# Patient Record
Sex: Male | Born: 1985 | Race: White | Hispanic: No | Smoking: Current every day smoker
Health system: Southern US, Community
[De-identification: ages and names within clinical notes are randomized; demographics above are authoritative.]

## PROBLEM LIST (undated history)

## (undated) DIAGNOSIS — E039 Hypothyroidism, unspecified: Secondary | ICD-10-CM

## (undated) DIAGNOSIS — F32A Depression, unspecified: Secondary | ICD-10-CM

## (undated) DIAGNOSIS — F329 Major depressive disorder, single episode, unspecified: Secondary | ICD-10-CM

## (undated) DIAGNOSIS — F4310 Post-traumatic stress disorder, unspecified: Secondary | ICD-10-CM

## (undated) DIAGNOSIS — Z789 Other specified health status: Secondary | ICD-10-CM

## (undated) DIAGNOSIS — IMO0001 Reserved for inherently not codable concepts without codable children: Secondary | ICD-10-CM

## (undated) DIAGNOSIS — F419 Anxiety disorder, unspecified: Secondary | ICD-10-CM

## (undated) DIAGNOSIS — E349 Endocrine disorder, unspecified: Secondary | ICD-10-CM

## (undated) DIAGNOSIS — E079 Disorder of thyroid, unspecified: Secondary | ICD-10-CM

## (undated) HISTORY — DX: Reserved for inherently not codable concepts without codable children: IMO0001

## (undated) HISTORY — PX: TOTAL ABDOMINAL HYSTERECTOMY W/ BILATERAL SALPINGOOPHORECTOMY: SHX83

## (undated) HISTORY — PX: ABDOMINAL HYSTERECTOMY: SHX81

## (undated) HISTORY — DX: Depression, unspecified: F32.A

## (undated) HISTORY — DX: Post-traumatic stress disorder, unspecified: F43.10

## (undated) HISTORY — DX: Hypothyroidism, unspecified: E03.9

## (undated) HISTORY — PX: COSMETIC SURGERY: SHX468

## (undated) HISTORY — DX: Other specified health status: Z78.9

## (undated) HISTORY — DX: Endocrine disorder, unspecified: E34.9

## (undated) HISTORY — PX: FRACTURE SURGERY: SHX138

## (undated) HISTORY — DX: Anxiety disorder, unspecified: F41.9

## (undated) HISTORY — DX: Disorder of thyroid, unspecified: E07.9

---
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
## 2014-03-05 HISTORY — PX: MASTECTOMY: SHX3

## 2017-02-26 ENCOUNTER — Emergency Department
Admission: EM | Admit: 2017-02-26 | Discharge: 2017-02-26 | Disposition: A | Discharge status: Home / Self Care | Payer: BC Managed Care – PPO | Attending: Emergency Medicine | Admitting: Emergency Medicine

## 2017-02-26 DIAGNOSIS — F17200 Nicotine dependence, unspecified, uncomplicated: Secondary | ICD-10-CM | POA: Insufficient documentation

## 2017-02-26 DIAGNOSIS — E079 Disorder of thyroid, unspecified: Secondary | ICD-10-CM | POA: Insufficient documentation

## 2017-02-26 DIAGNOSIS — N907 Vulvar cyst: Secondary | ICD-10-CM

## 2017-02-26 DIAGNOSIS — N5089 Other specified disorders of the male genital organs: Secondary | ICD-10-CM | POA: Insufficient documentation

## 2017-02-26 DIAGNOSIS — Z79899 Other long term (current) drug therapy: Secondary | ICD-10-CM | POA: Insufficient documentation

## 2017-02-26 MED ORDER — HYDROCODONE-ACETAMINOPHEN 5-325 MG PO TABS
1.00 | ORAL_TABLET | Freq: Four times a day (QID) | ORAL | 0 refills | Status: AC | PRN
Start: 2017-02-26 — End: ?

## 2017-02-26 MED ORDER — SULFAMETHOXAZOLE-TRIMETHOPRIM 800-160 MG PO TABS
1.00 | ORAL_TABLET | Freq: Two times a day (BID) | ORAL | 0 refills | Status: AC
Start: 2017-02-26 — End: 2017-03-05

## 2017-02-26 MED ORDER — CEPHALEXIN 500 MG PO CAPS
500.00 mg | ORAL_CAPSULE | Freq: Three times a day (TID) | ORAL | 0 refills | Status: AC
Start: 2017-02-26 — End: 2017-03-05

## 2017-02-26 MED ORDER — ONDANSETRON HCL 4 MG/2ML IJ SOLN
4.00 mg | Freq: Once | INTRAMUSCULAR | Status: AC
Start: 2017-02-26 — End: 2017-02-26
  Administered 2017-02-26: 05:00:00 4 mg via INTRAVENOUS
  Filled 2017-02-26: qty 2

## 2017-02-26 MED ORDER — MORPHINE SULFATE 4 MG/ML IJ/IV SOLN (WRAP)
4.0000 mg | Freq: Once | Status: AC
Start: 2017-02-26 — End: 2017-02-26
  Administered 2017-02-26: 05:00:00 4 mg via INTRAVENOUS
  Filled 2017-02-26: qty 1

## 2017-02-26 NOTE — ED Triage Notes (Signed)
Pt reports cyst in genital left area that started 2 days ago. Pt denies cysts or abscess in the past. Pt denies drainage from the area.

## 2017-02-26 NOTE — ED Notes (Signed)
Bed: A10  Expected date:   Expected time:   Means of arrival:   Comments:

## 2017-02-26 NOTE — Discharge Instructions (Signed)
Abscess    You were diagnosed with an abscess of the:   Skin.    An abscess is a sore that is infected and filled with pus. It is caused by an infection with bacteria. Sometimes a splinter or other object stuck in the skin can cause an infection that can become an abscess. The body traps the infection in a tight pocket to try to stop the infection from spreading to other areas. The usual treatment is to make a cut in the abscess so the pus can drain out. Most abscesses heal quickly with no need for antibiotics.    Your doctor has determined that antibiotics ARE necessary to treat your abscess. Fill the prescription and take all medications as prescribed until they are all gone.    The doctor suspects that your infection is resistant to (not killed by) the usual antibiotics. This infection is caused by bacteria called Methicillin-Resistant Staphylococcus Aureus (MRSA).   Your doctor prescribed an antibiotic that will work against MRSA. IT IS IMPORTANT to fill and finish this prescription.    A drain and/or packing have been placed in your abscess to help the wound heal. The packing in your wound will need to be changed. This can be done by your family doctor, a referral doctor, this facility or the nearest Emergency Department. Your doctor will set up a plan for you to have the packing changed. Leave the drain and packing in place until you see a doctor. The drain might fall out on its own. If this happens, cover the abscess with a clean dressing (bandage) and follow up with a doctor as scheduled. Until the packing is removed, don't soak the wound in water, like in a bathtub or pool. Short showers or sponge baths are okay. Keep the wound as dry and clean as possible.    YOU SHOULD SEEK MEDICAL ATTENTION IMMEDIATELY, EITHER HERE OR AT THE NEAREST EMERGENCY DEPARTMENT, IF ANY OF THE FOLLOWING OCCURS:   You see unusual redness or swelling.   You see red streaks on the arm or leg.   The wound or drainage  smells bad.   You have fever (temperature higher than 100.4F / 38C), chills, worse pain or swelling.        Thank you for choosing [facility] for your emergency care needs.  We strive to provide EXCELLENT care to you and your family.      If you do not continue to improve or your condition worsens, please contact your doctor or return immediately to the Emergency Department.    ONSITE PHARMACY  Our full service onsite pharmacy is a 2 minute walk from the ER.  Open Mon to Fri from 8 am to 8 pm, Sat and Sun 9 am to 5 pm. Ask an ED staff member for directions.  We accept all major insurances and prices are competitive with major retailers.  Ask your provider to print your prescriptions down to the pharmacy to speed you on your way home.      DOCTOR REFERRALS  Call [phone] (available 24 hours a day, 7 days a week) if you need any further referrals and we can help you find a primary care doctor or specialist.  Also, available online at:  [email or web address]    YOUR CONTACT INFORMATION  Before leaving please check with registration to make sure we have an up-to-date contact number.  You can call registration at [phone] to update your information.  For questions about   your hospital bill, please call [phone].  For questions about your Emergency Dept Physician bill please call [phone].      FREE HEALTH SERVICES  If you need help with health or social services, please call 2-1-1 for a free referral to resources in your area.  2-1-1 is a free service connecting people with information on health insurance, free clinics, pregnancy, mental health, dental care, food assistance, housing, and substance abuse counseling.  Also, available online at:  http://www.[facility].org    MEDICAL RECORDS AND TESTS  Certain laboratory test results do not come back the same day, for example urine cultures.   We will contact you if other important findings are noted.  Radiology films  are often reviewed again to ensure accuracy.  If there is any discrepancy, we will notify you.      Please call [phone] to pick up a complimentary CD of any radiology studies performed.  If you or your doctor would like to request a copy of your medical records, please call [phone].          ORTHOPEDIC INJURY   Please know that significant injuries can exist even when an initial x-ray is read as normal or negative.  This can occur because some fractures (broken bones) are not initially visible on x-rays.  For this reason, close outpatient follow-up with your primary care doctor or bone specialist (orthopedist) is required.    MEDICATIONS AND FOLLOWUP  Please be aware that some prescription medications can cause drowsiness.  Use caution when driving or operating machinery.    The examination and treatment you have received in our Emergency Department is provided on an emergency basis, and is not intended to be a substitute for your primary care physician.  It is important that your doctor checks you again and that you report any new or remaining problems at that time.      24 HOUR PHARMACIES  [facility] - [address], [place], [place] [address] (1.4 miles, 7 minutes)  Handout with directions available on request      ASSISTANCE WITH INSURANCE    Affordable Care Act  (ACA)  Call to start or finish an application, compare plans, enroll or ask a question.  1-800-318-2596  TTY: 1-855-889-4325  Web:  Healthcare.gov    Help Enrolling in Medicaid  Cover [place]  (855) 242-8282 (TOLL-FREE)  (888) 221-1590 (TTY)  Web:  Http://www.coverva.org    Local Help Enrolling in the ACA  [facility]  ([phone] 748-2580 (MAIN)  Email:  health-help@nvfs.org  Web:  Http://www.nvfs.org  Address:  [address], [address] [place], [place] [address]

## 2017-02-26 NOTE — ED Provider Notes (Signed)
Physician/Midlevel provider first contact with patient: 02/26/17 0981         EMERGENCY DEPARTMENT HISTORY AND PHYSICAL EXAM    Patient Name: Justin Bolton, Justin Bolton  Patient DOB:  21-Aug-1985  MRN:  [id number]  Room:  10/A10  Rendering Provider: Tresea Mall, PA-C    History of Presenting Illness     Chief Complaint:   Chief Complaint   Patient presents with   . Cyst     Mechanism of Injury:       Historian:  Patient   Onset:   2 days ago   Severity:  Mild      31 y.o. 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 Pt states 2 days ago he had onset of left labial pain and swelling. Symptoms have been increasing since. No drainage. No previous similar episode. No vaginal discharge. No fevers. No body aches or chills. No urinary symptoms. No meds taken at home.     PMD:  [facility], Out Of Network    Past Medical History     Past Medical History:   Diagnosis Date   . Disorder of thyroid        Past Surgical History     History reviewed. No pertinent surgical history.    Family History     No family history on file.    Social History     Social History     Social History   . Marital status: Divorced     Spouse name: N/A   . Number of children: N/A   . Years of education: N/A     Social History Main Topics   . Smoking status: Current Every Day Smoker   . Smokeless tobacco: Never Used   . Alcohol use Yes   . Drug use: Unknown   . Sexual activity: Not on file     Other Topics Concern   . Not on file     Social History Narrative   . No narrative on file       Allergies     No Known Allergies    Home Medications     Home medications reviewed by PA at 5:45 AM    No current facility-administered medications for this encounter.     Current Outpatient Prescriptions:   .  levothyroxine (SYNTHROID, LEVOTHROID) 150 MCG tablet, Take 150 mcg by mouth Once a day at 6:00am., Disp: , Rfl:   .  cephalexin (KEFLEX) 500 MG capsule, Take 1 capsule (500 mg total) by mouth 3 (three) times daily.for 7 days, Disp: 21  capsule, Rfl: 0  .  HYDROcodone-acetaminophen (NORCO) 5-325 MG per tablet, Take 1 tablet by mouth every 6 (six) hours as needed for Pain., Disp: 6 tablet, Rfl: 0  .  sulfamethoxazole-trimethoprim (BACTRIM DS) 800-160 MG per tablet, Take 1 tablet by mouth 2 (two) times daily.for 7 days, Disp: 14 tablet, Rfl: 0    ED Medications Administered     ED Medication Orders     Start Ordered     Status Ordering Provider    02/26/17 0420 02/26/17 0419  morphine injection 4 mg  Once     Route: Intravenous  Ordered Dose: 4 mg     Last MAR action:  Given Tresea Mall SIMONS    02/26/17 0420 02/26/17 0419  ondansetron (ZOFRAN) injection 4 mg  Once     Route: Intravenous  Ordered Dose: 4 mg     Last MAR action:  Given Jaslin Novitski SIMONS  Review of Systems     Constitutional: Negative for fever and chills.   Respiratory: Negative for shortness of breath.   Cardiovascular: Negative for chest pain.   GU: Positive for vaginal abscess.   Skin: Negative for rash.   Neurological: Negative for headache or dizziness.   All other systems reviewed and negative     VS     Patient Vitals for the past 24 hrs:   BP Temp src Pulse Resp SpO2 Height Weight   02/26/17 0516 115/63 - 87 - 96 % - -   02/26/17 0440 128/74 - (!) 101 - 98 % - -   02/26/17 0330 147/79 Oral 79 16 98 % 5\' 7"  (1.702 m) 84.8 kg       Physical Exam     Constitutional: Vital signs reviewed. Well appearing.  Head: Normocephalic, atraumatic  Eyes: Conjunctiva and sclera are normal.  No injection or discharge.  Neck: Normal range of motion. Trachea midline.  Respiratory/Chest: No respiratory distress.   GU: Dime sized area of fluctuation to left labia majora with edema extending towards clitoris. No drainage. No bleeding. Mild erythema.   Neurological: No focal motor deficits by observation. Speech normal.  Skin: Warm and dry. No rash.  Psychiatric: Alert and conversant.  Normal affect.  Normal insight.     Data Review     Nursing notes reviewed and agree:  Yes    Diagnostic Study Results     Labs:     Results     ** No results found for the last 24 hours. **          Radiologic Studies:  Radiology Results (24 Hour)     ** No results found for the last 24 hours. **      .    EKG:      Monitor:      Procedures     Incision and Drainage:  Verbal consent obtained  Performed by Tresea Mall, PA-C  Indication: Cutaneous Abscess  No contraindications  Anesthesia: Lido 1%without epi via local injection  Location: Left labia majora.   Incision made over area of fluctuance with #11 blade scalpel   Area explored for loculations  Drained:small amount of pus.   Dressing placed  Minimal blood loss, bleeding controlled after procedure.   Neurovascular status normal after procedure  No complications, patient tolerated procedure well.      MDM and Clinical Notes     Small amount of pus drained, pt reports feeling significantly better after drainage. Will cover with antibiotics for amount of edema to cover for cellulitic component. Advised f/u with his pmd or with obgyn for further management. Return instructions discussed. Pt advised to return to ER for worsening symptoms or other concerns. Pt ambulated from dept without difficulty upon discharge in no distress.          Diagnosis and Disposition     Clinical Impression  1. Vulvar cyst        Disposition  ED Disposition     ED Disposition Condition Date/Time Comment    Discharge  Tue Feb 26, 2017  5:11 AM Justin Bolton discharge to home/self care.    Condition at disposition: Stable          Prescriptions  Discharge Medication List as of 02/26/2017  5:11 AM      START taking these medications    Details   cephalexin (KEFLEX) 500 MG capsule Take 1 capsule (500 mg total) by mouth 3 (  three) times daily.for 7 days, Starting Tue 02/26/2017, Until Tue 03/05/2017, Print      HYDROcodone-acetaminophen (NORCO) 5-325 MG per tablet Take 1 tablet by mouth every 6 (six) hours as needed for Pain., Starting Tue 02/26/2017, Print       sulfamethoxazole-trimethoprim (BACTRIM DS) 800-160 MG per tablet Take 1 tablet by mouth 2 (two) times daily.for 7 days, Starting Tue 02/26/2017, Until Tue 03/05/2017, Print                Berna Bue Front[place], [place]  02/26/17 6045       Venancio Poisson, MD  02/26/17 [phone]

## 2018-06-12 DIAGNOSIS — Z789 Other specified health status: Secondary | ICD-10-CM | POA: Insufficient documentation

## 2018-06-12 DIAGNOSIS — F17200 Nicotine dependence, unspecified, uncomplicated: Secondary | ICD-10-CM | POA: Insufficient documentation

## 2018-06-12 DIAGNOSIS — F4310 Post-traumatic stress disorder, unspecified: Secondary | ICD-10-CM | POA: Insufficient documentation

## 2018-06-12 DIAGNOSIS — E039 Hypothyroidism, unspecified: Secondary | ICD-10-CM | POA: Insufficient documentation

## 2019-01-01 ENCOUNTER — Ambulatory Visit (INDEPENDENT_AMBULATORY_CARE_PROVIDER_SITE_OTHER): Payer: 59 | Admitting: Osteopathic Medicine

## 2019-01-01 ENCOUNTER — Other Ambulatory Visit: Payer: Self-pay

## 2019-01-01 VITALS — BP 129/84 | HR 93 | Temp 97.9°F | Ht 68.0 in | Wt 239.0 lb

## 2019-01-01 DIAGNOSIS — F4310 Post-traumatic stress disorder, unspecified: Secondary | ICD-10-CM | POA: Diagnosis not present

## 2019-01-01 DIAGNOSIS — E039 Hypothyroidism, unspecified: Secondary | ICD-10-CM

## 2019-01-01 DIAGNOSIS — Z0000 Encounter for general adult medical examination without abnormal findings: Secondary | ICD-10-CM

## 2019-01-01 DIAGNOSIS — F640 Transsexualism: Secondary | ICD-10-CM | POA: Diagnosis not present

## 2019-01-01 DIAGNOSIS — Z789 Other specified health status: Secondary | ICD-10-CM

## 2019-01-01 DIAGNOSIS — F17200 Nicotine dependence, unspecified, uncomplicated: Secondary | ICD-10-CM

## 2019-01-02 ENCOUNTER — Encounter: Payer: Self-pay | Admitting: Osteopathic Medicine

## 2019-01-02 MED ORDER — TESTOSTERONE CYPIONATE 200 MG/ML IM SOLN: 100.0000 mg | INTRAMUSCULAR | 1 refills | Status: DC

## 2019-01-02 MED ORDER — LEVOTHYROXINE SODIUM 175 MCG PO TABS: 175.0000 ug | ORAL_TABLET | Freq: Every day | ORAL | 0 refills | Status: DC

## 2019-01-02 NOTE — Progress Notes (Signed)
HPI: Daniel Nielsen is a 33 y.o. 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  he [facility] to [facility] today, 01/01/19,  for chief complaint of: New to establish care   Very pleasant patient here to establish care. He is AFAB, FTM transgender in need of refills on medications. No other complaints today. He and his fiancee recently moved to the area, she is currently pregnant and doing well. Only other medical history is hypothyroidism, and depression/PTSD and he is well controlled on medications as below. He sees a psychiatrist for Lexapro and Abilify Rx's         Past medical, surgical, social and family history reviewed:  There are no active problems to display for this patient.  Past Surgical History:  Procedure Laterality Date  . MASTECTOMY  2016   Social History   Tobacco Use  . Smoking status: Current Every Day Smoker    Packs/day: 0.50    Types: Cigarettes  . Smokeless tobacco: Never Used  Substance Use Topics  . Alcohol use: Not Currently    History reviewed. No pertinent family history.      Current medication list and allergy/intolerance information reviewed:    Current Outpatient Medications  Medication Sig Dispense Refill  . ARIPiprazole (ABILIFY) 5 MG tablet Take 5 mg by mouth daily.    Marland Kitchen. escitalopram (LEXAPRO) 20 MG tablet Take by mouth.    . levothyroxine (SYNTHROID) 175 MCG tablet Take 1 tablet (175 mcg total) by mouth daily before breakfast. 90 tablet 0  . testosterone cypionate (DEPOTESTOSTERONE CYPIONATE) 200 MG/ML injection Inject 0.5 mLs (100 mg total) into the muscle once a week. 10 mL 1   No current facility-administered medications for this visit.     No Known Allergies    Review of Systems:  Constitutional:  No  fever, no chills, No recent illness, No unintentional weight  changes. No significant fatigue.   HEENT: No  headache, no vision change, no hearing change, No sore throat, No  sinus pressure  Cardiac: No  chest pain, No  pressure, No palpitations, No  Orthopnea  Respiratory:  No  shortness of breath. No  Cough  Gastrointestinal: No  abdominal pain, No  nausea, No  vomiting,  No  blood in stool, No  diarrhea, No  constipation   Musculoskeletal: No new myalgia/arthralgia  Skin: No  Rash, No other wounds/concerning lesions  Genitourinary: No  incontinence, No  abnormal genital bleeding, No abnormal genital discharge  Hem/Onc: No  easy bruising/bleeding, No  abnormal lymph node  Endocrine: No cold intolerance,  No heat intolerance. No polyuria/polydipsia/polyphagia   Neurologic: No  weakness, No  dizziness, No  slurred speech/focal weakness/facial droop  Psychiatric: No  concerns with depression, No  concerns with anxiety, No sleep problems, No mood problems  Exam:  BP 129/84 (BP Location: Left Arm, Patient Position: Sitting, Cuff Size: Normal)   Pulse 93   Temp 97.9 F (36.6 C) (Oral)   Ht 5\' 8"  (1.727 m)   Wt 239 lb (108.4 kg)   BMI 36.34 kg/m   Constitutional: VS see above. General Appearance: alert, well-developed, well-nourished, NAD  Eyes: Normal lids and conjunctive, non-icteric sclera  Ears, Nose, Mouth, Throat:TM normal bilaterally.  Neck: No masses, trachea midline. No thyroid enlargement. No tenderness/mass appreciated. No lymphadenopathy  Respiratory: Normal respiratory effort. no wheeze, no rhonchi, no rales  Cardiovascular: S1/S2 normal, no murmur,  no rub/gallop auscultated. RRR. No lower extremity edema.  Gastrointestinal: Nontender, no masses. No hepatomegaly, no splenomegaly. No hernia appreciated. Bowel sounds normal. Rectal exam deferred.   Musculoskeletal: Gait normal. No clubbing/cyanosis of digits.   Neurological: Normal balance/coordination. No tremor.   Skin: warm, dry, intact. No  rash/ulcer.  Psychiatric: Normal judgment/insight. Normal mood and affect. Oriented x3.    No results found for this or any previous visit (from the past 72 hour(s)).  No results found.      ASSESSMENT/PLAN: The primary encounter diagnosis was Annual physical exam. Diagnoses of Male-to-male transgender person, Hypothyroidism, unspecified type, PTSD (post-traumatic stress disorder), and Smoker were also pertinent to this visit.     Meds ordered this encounter  Medications  . levothyroxine (SYNTHROID) 175 MCG tablet    Sig: Take 1 tablet (175 mcg total) by mouth daily before breakfast.    Dispense:  90 tablet    Refill:  0    Needs labs done prior to further refills - orders are in as of 01/02/19  . testosterone cypionate (DEPOTESTOSTERONE CYPIONATE) 200 MG/ML injection    Sig: Inject 0.5 mLs (100 mg total) into the muscle once a week.    Dispense:  10 mL    Refill:  1    Patient Instructions  General Preventive Care  Most recent routine screening lipids/other labs: ordered today, will plan to get these done next couple weeks.   Everyone should have blood pressure checked once per year. Goal 130/80 or less!   Tobacco: don't! Please let me know if you need help quitting!  Alcohol: responsible moderation is ok for most adults - if you have concerns about your alcohol intake, please talk to me!   Exercise: as tolerated to reduce risk of cardiovascular disease and diabetes. Strength training will also prevent osteoporosis.   Mental health: if need for mental health care (medicines, counseling, other), or concerns about moods, please let me or your psychiatrist know!   Sexual health: if need for STD testing, or if concerns with libido/pain problems, please let me know!   Advanced Directive: Living Will and/or Healthcare Power of Attorney recommended for all adults, regardless of age or health.  Vaccines  Flu vaccine: recommended for almost everyone, every fall.    Tetanus booster: Tdap recommended every 10 years.  Cancer screenings   Colon cancer screening: recommended for everyone starting at age 76 or 41  Prostate cancer screening: not needed for people without prostates!   Breast cancer screening: not needed for people without breast tissue!   Cervical cancer screening: Pap for people with a cervix, every 1 to 5 years depending on age and other risk factors.Smokers should get Pap testing more frequently! I can't think of a better reason to quit!   Lung cancer screening: CT chest every year for those age 29 to 31 years old with ?30 pack year smoking history, who either currently smoke or have quit within the past 15 years. Infection screenings . HIV: recommended screening at least once age 24-65, more often as needed. . Gonorrhea/Chlamydia: screening as needed. . Hepatitis C: recommended for anyone born 20-1963 . TB: certain at-risk populations, or depending on work requirements and/or travel history Other . Bone Density Test: recommended around age 23-70 . Abdominal Aortic Aneurysm: screening with ultrasound recommended once for cisgender men age 12-75 who have ever smoked, no good data on transgender men but I would bet the testosterone + smoking gives same risk to both groups so we should plan  on this test in the future, unless guidelines change by then!        Visit summary with medication list and pertinent instructions was printed for patient to review. All questions at time of visit were answered - patient instructed to contact office with any additional concerns or updates. ER/RTC precautions were reviewed with the patient.      Please note: voice recognition software was used to produce this document, and typos may escape review. Please contact Dr. Lyn Hollingshead for any needed clarifications.     Follow-up plan: Return in about 6 months (around 07/02/2019) for monitor testosterone therapy .

## 2019-01-02 NOTE — Patient Instructions (Addendum)
General Preventive Care  Most recent routine screening lipids/other labs: ordered today, will plan to get these done next couple weeks.   Everyone should have blood pressure checked once per year. Goal 130/80 or less!   Tobacco: don't! Please let me know if you need help quitting!  Alcohol: responsible moderation is ok for most adults - if you have concerns about your alcohol intake, please talk to me!   Exercise: as tolerated to reduce risk of cardiovascular disease and diabetes. Strength training will also prevent osteoporosis.   Mental health: if need for mental health care (medicines, counseling, other), or concerns about moods, please let me or your psychiatrist know!   Sexual health: if need for STD testing, or if concerns with libido/pain problems, please let me know!   Advanced Directive: Living Will and/or Healthcare Power of Attorney recommended for all adults, regardless of age or health.  Vaccines  Flu vaccine: recommended for almost everyone, every fall.   Tetanus booster: Tdap recommended every 10 years.  Cancer screenings   Colon cancer screening: recommended for everyone starting at age 91 or 88  Prostate cancer screening: not needed for people without prostates!   Breast cancer screening: not needed for people without breast tissue!   Cervical cancer screening: Pap for people with a cervix, every 1 to 5 years depending on age and other risk factors.Smokers should get Pap testing more frequently! I can't think of a better reason to quit!   Lung cancer screening: CT chest every year for those age 20 to 31 years old with ?30 pack year smoking history, who either currently smoke or have quit within the past 15 years. Infection screenings . HIV: recommended screening at least once age 42-65, more often as needed. . Gonorrhea/Chlamydia: screening as needed. . Hepatitis C: recommended for anyone born 13-1963 . TB: certain at-risk populations, or depending on work  requirements and/or travel history Other . Bone Density Test: recommended around age 27-70 . Abdominal Aortic Aneurysm: screening with ultrasound recommended once for cisgender men age 92-75 who have ever smoked, no good data on transgender men but I would bet the testosterone + smoking gives same risk to both groups so we should plan on this test in the future, unless guidelines change by then!

## 2019-01-05 ENCOUNTER — Telehealth: Payer: Self-pay | Admitting: Osteopathic Medicine

## 2019-01-05 NOTE — Telephone Encounter (Signed)
Received fax for prior authorization on Testosterone sent through cover my meds and it was approved waiting on fax approval to get auth number and information. - CF

## 2019-01-05 NOTE — Telephone Encounter (Signed)
Received Faxed authorization   Valid: 01/05/19 - 01/05/20 PA# [employer] - [place]- [place]- [phone] SS

## 2019-01-21 ENCOUNTER — Emergency Department (HOSPITAL_COMMUNITY): Payer: Worker's Compensation

## 2019-01-21 ENCOUNTER — Other Ambulatory Visit: Payer: Self-pay

## 2019-01-21 ENCOUNTER — Encounter (HOSPITAL_COMMUNITY): Payer: Self-pay | Admitting: *Deleted

## 2019-01-21 ENCOUNTER — Emergency Department (HOSPITAL_COMMUNITY)
Admission: EM | Admit: 2019-01-21 | Discharge: 2019-01-21 | Disposition: A | Discharge status: Home / Self Care | Payer: Worker's Compensation | Attending: Emergency Medicine | Admitting: Emergency Medicine

## 2019-01-21 DIAGNOSIS — Z79899 Other long term (current) drug therapy: Secondary | ICD-10-CM | POA: Diagnosis not present

## 2019-01-21 DIAGNOSIS — R52 Pain, unspecified: Secondary | ICD-10-CM

## 2019-01-21 DIAGNOSIS — W010XXA Fall on same level from slipping, tripping and stumbling without subsequent striking against object, initial encounter: Secondary | ICD-10-CM | POA: Diagnosis not present

## 2019-01-21 DIAGNOSIS — S83004A Unspecified dislocation of right patella, initial encounter: Secondary | ICD-10-CM | POA: Insufficient documentation

## 2019-01-21 DIAGNOSIS — F17210 Nicotine dependence, cigarettes, uncomplicated: Secondary | ICD-10-CM | POA: Insufficient documentation

## 2019-01-21 DIAGNOSIS — F1210 Cannabis abuse, uncomplicated: Secondary | ICD-10-CM | POA: Insufficient documentation

## 2019-01-21 DIAGNOSIS — S82891A Other fracture of right lower leg, initial encounter for closed fracture: Secondary | ICD-10-CM | POA: Insufficient documentation

## 2019-01-21 DIAGNOSIS — Z20828 Contact with and (suspected) exposure to other viral communicable diseases: Secondary | ICD-10-CM | POA: Insufficient documentation

## 2019-01-21 DIAGNOSIS — Y990 Civilian activity done for income or pay: Secondary | ICD-10-CM | POA: Insufficient documentation

## 2019-01-21 DIAGNOSIS — Y929 Unspecified place or not applicable: Secondary | ICD-10-CM | POA: Diagnosis not present

## 2019-01-21 DIAGNOSIS — S8991XA Unspecified injury of right lower leg, initial encounter: Secondary | ICD-10-CM | POA: Diagnosis present

## 2019-01-21 DIAGNOSIS — F640 Transsexualism: Secondary | ICD-10-CM | POA: Insufficient documentation

## 2019-01-21 DIAGNOSIS — Y939 Activity, unspecified: Secondary | ICD-10-CM | POA: Insufficient documentation

## 2019-01-21 DIAGNOSIS — W19XXXA Unspecified fall, initial encounter: Secondary | ICD-10-CM

## 2019-01-21 LAB — SARS CORONAVIRUS 2 (TAT 6-24 HRS): SARS Coronavirus 2: NEGATIVE

## 2019-01-21 MED ORDER — FENTANYL CITRATE (PF) 100 MCG/2ML IJ SOLN
50.0000 ug | Freq: Once | INTRAMUSCULAR | Status: DC
  Filled 2019-01-21: qty 2

## 2019-01-21 MED ORDER — OXYCODONE-ACETAMINOPHEN 5-325 MG PO TABS: 1.0000 | ORAL_TABLET | Freq: Four times a day (QID) | ORAL | 0 refills | Status: DC | PRN

## 2019-01-21 MED ORDER — OXYCODONE-ACETAMINOPHEN 5-325 MG PO TABS
1.0000 | ORAL_TABLET | Freq: Once | ORAL | Status: AC
  Administered 2019-01-21: 1 via ORAL
  Filled 2019-01-21: qty 1

## 2019-01-21 MED ORDER — FENTANYL CITRATE (PF) 100 MCG/2ML IJ SOLN
75.0000 ug | Freq: Once | INTRAMUSCULAR | Status: AC
  Administered 2019-01-21: 75 ug via INTRAVENOUS

## 2019-01-21 NOTE — Progress Notes (Signed)
Orthopedic Tech Progress Note Patient Details:  Demarious Kapur [birth year] [id number]  Ortho Devices Type of Ortho Device: Crutches, Stirrup splint, Post (short leg) splint, Ace wrap, Knee Immobilizer Ortho Device/Splint Location: right leg Ortho Device/Splint Interventions: Ordered, Application, Adjustment   Post Interventions Patient Tolerated: Well Instructions Provided: Adjustment of device, Care of device, Poper ambulation with device   Staci Righter 01/21/2019, 2:46 PM

## 2019-01-21 NOTE — H&P (View-Only) (Signed)
Reason for Consult:Right patella dislocation, right ankle fx Referring Physician: A Curatolo  Daniel Nielsen is an 33 y.o. male.  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 He had immediate knee and ankle pain and could not bear weight. He came to the ED where x-rays showed a right patella dislocation and a right ankle fx and orthopedic surgery was consulted. The patella was relocated by the EDPA.  Past Medical History:  Diagnosis Date  . Anxiety   . Depression   . 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 Hypothyroid   . PTSD (post-traumatic stress disorder)     Past Surgical History:  Procedure Laterality Date  . MASTECTOMY  2016    History reviewed. No pertinent family history.  Social History:  reports that he has been smoking cigarettes. He has been smoking about 0.50 packs per day. He has never used smokeless tobacco. He reports previous alcohol use. He reports current drug use. Drug: Marijuana.  Allergies: No Known Allergies  Medications: I have reviewed the patient's current medications.  No results found for this or any previous visit (from the past 48 hour(s)).  Dg Knee 1-2 Views Right  Result Date: 01/21/2019 CLINICAL DATA:  Recent fall from standing, initial encounter EXAM: RIGHT KNEE - 1-2 VIEW COMPARISON:  None. FINDINGS: The patella is laterally dislocated. No other fracture is seen. No gross soft tissue abnormality is noted. IMPRESSION: Patellar dislocation laterally. Electronically Signed   By: Alcide Clever M.D.   On: 01/21/2019 12:44   Dg Ankle Complete Right  Result Date: 01/21/2019 CLINICAL DATA:  Recent fall with ankle pain, initial encounter EXAM: RIGHT ANKLE - COMPLETE 3+ VIEW COMPARISON:  None. FINDINGS: Soft tissue swelling is noted particularly medially. There is a transverse fracture through the medial malleolus which extends posteriorly. A posterior malleolar fracture is noted as well. No  definitive lateral malleolar fracture is noted. IMPRESSION: Medial and posterior malleolar fractures with associated soft tissue swelling Electronically Signed   By: Alcide Clever M.D.   On: 01/21/2019 12:45   Dg Knee Right Port  Result Date: 01/21/2019 CLINICAL DATA:  Status post reduction of patellar dislocation. EXAM: PORTABLE RIGHT KNEE - 1-2 VIEW COMPARISON:  Same day. FINDINGS: Lateral patellar dislocation noted on prior exam has been successfully reduced. No fracture is noted. Joint spaces are intact. IMPRESSION: Successful reduction of lateral patellar dislocation. Electronically Signed   By: Lupita Raider M.D.   On: 01/21/2019 13:18    Review of Systems  Constitutional: Negative for weight loss.  HENT: Negative for ear discharge, ear pain, hearing loss and tinnitus.   Eyes: Negative for blurred vision, double vision, photophobia and pain.  Respiratory: Negative for cough, sputum production and shortness of breath.   Cardiovascular: Negative for chest pain.  Gastrointestinal: Negative for abdominal pain, nausea and vomiting.  Genitourinary: Negative for dysuria, flank pain, frequency and urgency.  Musculoskeletal: Positive for joint pain (Right knee, ankle). Negative for back pain, falls, myalgias and neck pain.  Neurological: Negative for dizziness, tingling, sensory change, focal weakness, loss of consciousness and headaches.  Endo/Heme/Allergies: Does not bruise/bleed easily.  Psychiatric/Behavioral: Negative for depression, memory loss and substance abuse. The patient is not nervous/anxious.    Blood pressure 114/81, pulse 65, temperature 98.3 F (36.8 C), temperature source Oral, resp. rate 17, height 5\' 8"  (1.727 m), weight 108.4 kg, SpO2 100 %. Physical Exam  Constitutional: He appears well-developed and well-nourished. No distress.  HENT:  Head: Normocephalic and atraumatic.  Eyes: Conjunctivae are normal. Right eye exhibits no discharge. Left eye exhibits no discharge. No  scleral icterus.  Neck: Normal range of motion.  Cardiovascular: Normal rate and regular rhythm.  Respiratory: Effort normal. No respiratory distress.  Musculoskeletal:     Comments: RLE No traumatic wounds, ecchymosis, or rash  TTP knee, ankle, mod ankle edema  No knee effusion  Knee stable to varus/ valgus and anterior/posterior stress  Sens DPN, SPN, TN intact  Motor EHL, ext, flex, evers 5/5  DP 2+, PT 0, No significant edema  Neurological: He is alert.  Skin: Skin is warm and dry. He is not diaphoretic.  Psychiatric: He has a normal mood and affect. His behavior is normal.    Assessment/Plan: Right patella dislocation -- KI Right ankle fx -- Will need ORIF, plan Friday by Dr. Doreatha Martin. Elevation and ice in the meantime. Tobacco use -- Encouraged cessation    Lisette Abu, PA-C Orthopedic Surgery [phone] 01/21/2019, 1:44 PM

## 2019-01-21 NOTE — ED Triage Notes (Addendum)
Pt reports he fell down a hill when he was getting back into [employer]. Pt has injury to RT knee.

## 2019-01-21 NOTE — Consult Note (Signed)
Reason for Consult:Right patella dislocation, right ankle fx Referring Physician: A Curatolo  Daniel Nielsen is an 33 y.o. male.  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 He had immediate knee and ankle pain and could not bear weight. He came to the ED where x-rays showed a right patella dislocation and a right ankle fx and orthopedic surgery was consulted. The patella was relocated by the EDPA.  Past Medical History:  Diagnosis Date  . Anxiety   . Depression   . 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 Hypothyroid   . PTSD (post-traumatic stress disorder)     Past Surgical History:  Procedure Laterality Date  . MASTECTOMY  2016    History reviewed. No pertinent family history.  Social History:  reports that he has been smoking cigarettes. He has been smoking about 0.50 packs per day. He has never used smokeless tobacco. He reports previous alcohol use. He reports current drug use. Drug: Marijuana.  Allergies: No Known Allergies  Medications: I have reviewed the patient's current medications.  No results found for this or any previous visit (from the past 48 hour(s)).  Dg Knee 1-2 Views Right  Result Date: 01/21/2019 CLINICAL DATA:  Recent fall from standing, initial encounter EXAM: RIGHT KNEE - 1-2 VIEW COMPARISON:  None. FINDINGS: The patella is laterally dislocated. No other fracture is seen. No gross soft tissue abnormality is noted. IMPRESSION: Patellar dislocation laterally. Electronically Signed   By: Alcide Clever M.D.   On: 01/21/2019 12:44   Dg Ankle Complete Right  Result Date: 01/21/2019 CLINICAL DATA:  Recent fall with ankle pain, initial encounter EXAM: RIGHT ANKLE - COMPLETE 3+ VIEW COMPARISON:  None. FINDINGS: Soft tissue swelling is noted particularly medially. There is a transverse fracture through the medial malleolus which extends posteriorly. A posterior malleolar fracture is noted as well. No  definitive lateral malleolar fracture is noted. IMPRESSION: Medial and posterior malleolar fractures with associated soft tissue swelling Electronically Signed   By: Alcide Clever M.D.   On: 01/21/2019 12:45   Dg Knee Right Port  Result Date: 01/21/2019 CLINICAL DATA:  Status post reduction of patellar dislocation. EXAM: PORTABLE RIGHT KNEE - 1-2 VIEW COMPARISON:  Same day. FINDINGS: Lateral patellar dislocation noted on prior exam has been successfully reduced. No fracture is noted. Joint spaces are intact. IMPRESSION: Successful reduction of lateral patellar dislocation. Electronically Signed   By: Lupita Raider M.D.   On: 01/21/2019 13:18    Review of Systems  Constitutional: Negative for weight loss.  HENT: Negative for ear discharge, ear pain, hearing loss and tinnitus.   Eyes: Negative for blurred vision, double vision, photophobia and pain.  Respiratory: Negative for cough, sputum production and shortness of breath.   Cardiovascular: Negative for chest pain.  Gastrointestinal: Negative for abdominal pain, nausea and vomiting.  Genitourinary: Negative for dysuria, flank pain, frequency and urgency.  Musculoskeletal: Positive for joint pain (Right knee, ankle). Negative for back pain, falls, myalgias and neck pain.  Neurological: Negative for dizziness, tingling, sensory change, focal weakness, loss of consciousness and headaches.  Endo/Heme/Allergies: Does not bruise/bleed easily.  Psychiatric/Behavioral: Negative for depression, memory loss and substance abuse. The patient is not nervous/anxious.    Blood pressure 114/81, pulse 65, temperature 98.3 F (36.8 C), temperature source Oral, resp. rate 17, height 5\' 8"  (1.727 m), weight 108.4 kg, SpO2 100 %. Physical Exam  Constitutional: He appears well-developed and well-nourished. No distress.  HENT:  Head: Normocephalic and atraumatic.  Eyes: Conjunctivae are normal. Right eye exhibits no discharge. Left eye exhibits no discharge. No  scleral icterus.  Neck: Normal range of motion.  Cardiovascular: Normal rate and regular rhythm.  Respiratory: Effort normal. No respiratory distress.  Musculoskeletal:     Comments: RLE No traumatic wounds, ecchymosis, or rash  TTP knee, ankle, mod ankle edema  No knee effusion  Knee stable to varus/ valgus and anterior/posterior stress  Sens DPN, SPN, TN intact  Motor EHL, ext, flex, evers 5/5  DP 2+, PT 0, No significant edema  Neurological: He is alert.  Skin: Skin is warm and dry. He is not diaphoretic.  Psychiatric: He has a normal mood and affect. His behavior is normal.    Assessment/Plan: Right patella dislocation -- KI Right ankle fx -- Will need ORIF, plan Friday by Dr. Doreatha Martin. Elevation and ice in the meantime. Tobacco use -- Encouraged cessation    Lisette Abu, PA-C Orthopedic Surgery [phone] 01/21/2019, 1:44 PM

## 2019-01-21 NOTE — ED Provider Notes (Signed)
[facility] EMERGENCY DEPARTMENT Provider Note   [id number] Arrival date & time: 01/21/19  1143     History   Chief Complaint Chief Complaint  Patient presents with  . Knee Pain    HPI Daniel Nielsen is a 33 y.o. male presenting to the emergency department via EMS with sudden onset of right knee pain and deformity that began after mechanical fall prior to arrival.  Patient states he was walking down a hill and tripped causing injury to his right knee.  He noticed obvious deformity with significant pain with any movement.  He has no prior injuries to the right knee.  He does endorse some medial right ankle pain as well.  Denies numbness to distal right leg, hip pain, wounds, or other injuries. Last meal was about 3 hours prior to arrival. Not on anticoagulation.  Patient was administered 50 mics of fentanyl in route, however patient states the medication has worn off and his pain is returning.     The history is provided by the patient.    Past Medical History:  Diagnosis Date  . Anxiety   . Depression   . 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 Hypothyroid   . PTSD (post-traumatic stress disorder)     There are no active problems to display for this patient.   Past Surgical History:  Procedure Laterality Date  . MASTECTOMY  2016        Home Medications    Prior to Admission medications   Medication Sig Start Date End Date Taking? Authorizing Provider  ARIPiprazole (ABILIFY) 5 MG tablet Take 5 mg by mouth daily. 11/15/18   [provider]  escitalopram (LEXAPRO) 20 MG tablet Take by mouth.    [provider]  levothyroxine (SYNTHROID) 175 MCG tablet Take 1 tablet (175 mcg total) by mouth daily before breakfast. 01/02/19   Daniel Nielsen, [name], DO  oxyCODONE-acetaminophen (PERCOCET/ROXICET) 5-325 MG tablet Take 1-2 tablets by mouth every 6 (six) hours as needed for severe pain. 01/21/19   Robinson, [place] N,  PA-C  testosterone cypionate (DEPOTESTOSTERONE CYPIONATE) 200 MG/ML injection Inject 0.5 mLs (100 mg total) into the muscle once a week. 01/02/19   Daniel Nielsen, [name], DO    Family History History reviewed. No pertinent family history.  Social History Social History   Tobacco Use  . Smoking status: Current Every Day Smoker    Packs/day: 0.50    Types: Cigarettes  . Smokeless tobacco: Never Used  Substance Use Topics  . Alcohol use: Not Currently  . Drug use: Yes    Types: Marijuana     Allergies   Patient has no known allergies.   Review of Systems Review of Systems  Musculoskeletal: Positive for arthralgias and joint swelling.  Skin: Negative for color change and wound.  Neurological: Negative for numbness.  All other systems reviewed and are negative.    Physical Exam Updated Vital Signs BP 114/81 (BP Location: Right Arm)   Pulse 65   Temp 98.3 F (36.8 C) (Oral)   Resp 17   Ht 5\' 8"  (1.727 m)   Wt 108.4 kg   SpO2 100%   BMI 36.34 kg/m   Physical Exam Vitals signs and nursing note reviewed.  Constitutional:      General: He is not in acute distress.    Appearance: He is well-developed. He is obese.  HENT:     Head: Normocephalic and atraumatic.  Eyes:     Conjunctiva/sclera: Conjunctivae normal.  Cardiovascular:     Rate and Rhythm: Normal rate.  Pulmonary:     Effort: Pulmonary effort is normal.  Abdominal:     Palpations: Abdomen is soft.  Musculoskeletal:     Comments: Right knee with swelling and obvious deformity present.  Patella appears to be laterally oriented.  Generalized tenderness is present.  Knee is in about 30 degrees of flexion. Right ankle with swelling and tenderness along the medial aspect.  Pain with any range of motion. Compartments are soft throughout the entire right lower extremity.  Normal distal sensation.  Strong DP pulse. Hip is benign.  Skin:    General: Skin is warm.  Neurological:     Mental Status: He is alert.   Psychiatric:        Behavior: Behavior normal.      ED Treatments / Results  Labs (all labs ordered are listed, but only abnormal results are displayed) Labs Reviewed  SARS CORONAVIRUS 2 (TAT 6-24 HRS)    EKG None  Radiology Dg Knee 1-2 Views Right  Result Date: 01/21/2019 CLINICAL DATA:  Recent fall from standing, initial encounter EXAM: RIGHT KNEE - 1-2 VIEW COMPARISON:  None. FINDINGS: The patella is laterally dislocated. No other fracture is seen. No gross soft tissue abnormality is noted. IMPRESSION: Patellar dislocation laterally. Electronically Signed   By: Alcide Clever M.D.   On: 01/21/2019 12:44   Dg Ankle Complete Right  Result Date: 01/21/2019 CLINICAL DATA:  Recent fall with ankle pain, initial encounter EXAM: RIGHT ANKLE - COMPLETE 3+ VIEW COMPARISON:  None. FINDINGS: Soft tissue swelling is noted particularly medially. There is a transverse fracture through the medial malleolus which extends posteriorly. A posterior malleolar fracture is noted as well. No definitive lateral malleolar fracture is noted. IMPRESSION: Medial and posterior malleolar fractures with associated soft tissue swelling Electronically Signed   By: Alcide Clever M.D.   On: 01/21/2019 12:45   Dg Knee Right Port  Result Date: 01/21/2019 CLINICAL DATA:  Status post reduction of patellar dislocation EXAM: PORTABLE RIGHT KNEE - 1 VIEW COMPARISON:  Film from earlier in the same day. FINDINGS: Lateral view of the right knee shows the patella to be appropriately placed. No other focal abnormality is noted. IMPRESSION: Status post patellar reduction. Electronically Signed   By: Alcide Clever M.D.   On: 01/21/2019 13:44   Dg Knee Right Port  Result Date: 01/21/2019 CLINICAL DATA:  Status post reduction of patellar dislocation. EXAM: PORTABLE RIGHT KNEE - 1-2 VIEW COMPARISON:  Same day. FINDINGS: Lateral patellar dislocation noted on prior exam has been successfully reduced. No fracture is noted. Joint spaces  are intact. IMPRESSION: Successful reduction of lateral patellar dislocation. Electronically Signed   By: Lupita Raider M.D.   On: 01/21/2019 13:18    Procedures Reduction of dislocation  Date/Time: 01/21/2019 1:00 PM Performed by: Robinson, [place] N, PA-C Authorized by: Robinson, [place] N, PA-C  Consent: Verbal consent obtained. Risks and benefits: risks, benefits and alternatives were discussed Consent given by: patient Patient understanding: patient states understanding of the procedure being performed Imaging studies: imaging studies available Required items: required blood products, implants, devices, and special equipment available Patient identity confirmed: verbally with patient Time out: Immediately prior to procedure a "time out" was called to verify the correct patient, procedure, equipment, support staff and site/side marked as required. Local anesthesia used: no  Anesthesia: Local anesthesia used: no Patient tolerance: patient tolerated the procedure well with no immediate complications Comments: Reduction of right patella  dislocation was performed at bedside.  Analgesia used was IV fentanyl.  Knee was gently extended with steady pressure applied to the lateral aspect of the patella in the medial direction.  The patella easily reduced back into normal anatomical alignment, knee remains in extension post-reduction.  Patient's pain improved after procedure.  Postreduction neurovascularly intact.  Reduction films ordered.    (including critical care time)  Medications Ordered in ED Medications  fentaNYL (SUBLIMAZE) injection 75 mcg (75 mcg Intravenous Given 01/21/19 1212)  oxyCODONE-acetaminophen (PERCOCET/ROXICET) 5-325 MG per tablet 1 tablet (1 tablet Oral Given 01/21/19 1356)     Initial Impression / Assessment and Plan / ED Course  I have reviewed the triage vital signs and the nursing notes.  Pertinent labs & imaging results that were available during my care of  the patient were reviewed by me and considered in my medical decision making (see chart for details).  Clinical Course as of Jan 21 1523  Wed Jan 21, 2019  1324 Per ortho PA, ORIF of the ankle by Dr. Doreatha Martin on Friday. Requests pre-op covid test. Will place in knee immobilizer and short leg splint.     [JR]    Clinical Course User Index [JR] Robinson, [place] N, PA-C       Patient presenting to the ED via EMS after mechanical fall with right knee pain and deformity, as well as right ankle pain.  Patient was walking down a hill, tripped and fell causing injury.  No head trauma or LOC.  On exam, right knee with swelling and obvious deformity, right patella appears to be displaced laterally.  Compartments are soft.  Right ankle with tenderness along the medial malleolus.  Neurovascularly intact.  No other evident injuries.  Imaging of the right knee showing lateral dislocation of the patella, right ankle with medial and posterior malleolus fracture.  Pain is adequately managed with fentanyl.  Discussed with patient bedside reduction of patella, patient consented to reduction which was performed by myself.  Patella was easily reduced with knee extension and pressure to the patella medially.  Pain improved after reduction and postreduction films confirm successful reduction without bony fragments.  Ortho PA Dellis Filbert consulted regarding malleoli fractures, who consulted with Dr. Doreatha Martin.  Patient placed in knee immobilizer splint and short leg splint.  Patient will undergo ORIF by Dr. Doreatha Martin on Friday of ankle fractures.  Pain adequately managed in the ED.  Discussed symptom management.  Patient prescribed pain medication, discussed importance of elevation and NSAIDs to help alleviate swelling. Pt safe for discharge.  Discussed results, findings, treatment and follow up. Patient advised of return precautions. Patient verbalized understanding and agreed with plan.   Final Clinical Impressions(s) / ED  Diagnoses   Final diagnoses:  Closed dislocation of right patella, initial encounter  Closed fracture of malleolus of right ankle, initial encounter    ED Discharge Orders         Ordered    oxyCODONE-acetaminophen (PERCOCET/ROXICET) 5-325 MG tablet  Every 6 hours PRN     01/21/19 1434           Robinson, [place] N, PA-C 01/21/19 [place], [place], DO 01/22/19 1016

## 2019-01-21 NOTE — Discharge Instructions (Addendum)
Please read instructions below. Keep the splints clean, dry and in place at all times.  Elevate your right leg as much as possible to help with swelling. Take 600mg  of ibuprofen every 6 hours to help with pain and swelling. You can take oxycodone every 4-6 hours as needed for breakthrough/severe pain. You are scheduled for orthopedic surgery for repair of your ankle on Friday with Dr. Doreatha Martin. You should receive a call with further pre-operative instructions. Return to the ED for concerning symptoms.

## 2019-01-22 ENCOUNTER — Other Ambulatory Visit: Payer: Self-pay

## 2019-01-22 ENCOUNTER — Encounter (HOSPITAL_COMMUNITY): Payer: Self-pay | Admitting: *Deleted

## 2019-01-22 NOTE — Progress Notes (Signed)
Spoke with pt for pre-op call. Pt denies cardiac history, HTN or Diabetes.   Pt had his Covid test done on 01/21/19 and it is negative. States he's been in quarantine since the test was done.   Pt voiced understanding of the visitation policy.

## 2019-01-23 ENCOUNTER — Ambulatory Visit (HOSPITAL_COMMUNITY): Payer: 59 | Admitting: Certified Registered"

## 2019-01-23 ENCOUNTER — Ambulatory Visit (HOSPITAL_COMMUNITY): Payer: 59

## 2019-01-23 ENCOUNTER — Encounter (HOSPITAL_COMMUNITY): Payer: Self-pay | Admitting: *Deleted

## 2019-01-23 ENCOUNTER — Other Ambulatory Visit: Payer: Self-pay

## 2019-01-23 ENCOUNTER — Encounter (HOSPITAL_COMMUNITY)
Admission: RE | Disposition: A | Discharge status: Home / Self Care | Payer: Self-pay | Source: Home / Self Care | Attending: Student

## 2019-01-23 ENCOUNTER — Ambulatory Visit (HOSPITAL_COMMUNITY)
Admission: RE | Admit: 2019-01-23 | Discharge: 2019-01-23 | Disposition: A | Discharge status: Home / Self Care | Payer: 59 | Attending: Student | Admitting: Student

## 2019-01-23 DIAGNOSIS — S82831A Other fracture of upper and lower end of right fibula, initial encounter for closed fracture: Secondary | ICD-10-CM | POA: Insufficient documentation

## 2019-01-23 DIAGNOSIS — F329 Major depressive disorder, single episode, unspecified: Secondary | ICD-10-CM | POA: Insufficient documentation

## 2019-01-23 DIAGNOSIS — F17210 Nicotine dependence, cigarettes, uncomplicated: Secondary | ICD-10-CM | POA: Insufficient documentation

## 2019-01-23 DIAGNOSIS — F4310 Post-traumatic stress disorder, unspecified: Secondary | ICD-10-CM | POA: Diagnosis not present

## 2019-01-23 DIAGNOSIS — F419 Anxiety disorder, unspecified: Secondary | ICD-10-CM | POA: Insufficient documentation

## 2019-01-23 DIAGNOSIS — S82841A Displaced bimalleolar fracture of right lower leg, initial encounter for closed fracture: Secondary | ICD-10-CM | POA: Diagnosis present

## 2019-01-23 DIAGNOSIS — S83004A Unspecified dislocation of right patella, initial encounter: Secondary | ICD-10-CM | POA: Diagnosis not present

## 2019-01-23 DIAGNOSIS — E039 Hypothyroidism, unspecified: Secondary | ICD-10-CM | POA: Diagnosis not present

## 2019-01-23 DIAGNOSIS — W010XXA Fall on same level from slipping, tripping and stumbling without subsequent striking against object, initial encounter: Secondary | ICD-10-CM | POA: Insufficient documentation

## 2019-01-23 DIAGNOSIS — Z95828 Presence of other vascular implants and grafts: Secondary | ICD-10-CM

## 2019-01-23 DIAGNOSIS — Z419 Encounter for procedure for purposes other than remedying health state, unspecified: Secondary | ICD-10-CM

## 2019-01-23 DIAGNOSIS — T148XXA Other injury of unspecified body region, initial encounter: Secondary | ICD-10-CM

## 2019-01-23 HISTORY — PX: ORIF ANKLE FRACTURE: SHX5408

## 2019-01-23 LAB — CBC
HCT: 45 % (ref 39.0–52.0)
Hemoglobin: 14.8 g/dL (ref 13.0–17.0)
MCH: 30.1 pg (ref 26.0–34.0)
MCHC: 32.9 g/dL (ref 30.0–36.0)
MCV: 91.6 fL (ref 80.0–100.0)
Platelets: 228 10*3/uL (ref 150–400)
RBC: 4.91 MIL/uL (ref 4.22–5.81)
RDW: 12.3 % (ref 11.5–15.5)
WBC: 7.9 10*3/uL (ref 4.0–10.5)
nRBC: 0 % (ref 0.0–0.2)

## 2019-01-23 LAB — BASIC METABOLIC PANEL
Anion gap: 11 (ref 5–15)
BUN: 12 mg/dL (ref 6–20)
CO2: 20 mmol/L — ABNORMAL LOW (ref 22–32)
Calcium: 8.4 mg/dL — ABNORMAL LOW (ref 8.9–10.3)
Chloride: 106 mmol/L (ref 98–111)
Creatinine, Ser: 0.94 mg/dL (ref 0.61–1.24)
GFR calc Af Amer: >60 mL/min (ref >60)
GFR calc non Af Amer: >60 mL/min (ref >60)
Glucose, Bld: 89 mg/dL (ref 70–99)
Potassium: 3.5 mmol/L (ref 3.5–5.1)
Sodium: 137 mmol/L (ref 135–145)

## 2019-01-23 SURGERY — OPEN REDUCTION INTERNAL FIXATION (ORIF) ANKLE FRACTURE
Anesthesia: General | Site: Ankle | Laterality: Right

## 2019-01-23 MED ORDER — FENTANYL CITRATE (PF) 100 MCG/2ML IJ SOLN
INTRAMUSCULAR | Status: DC | PRN
  Administered 2019-01-23: 100 ug via INTRAVENOUS
  Administered 2019-01-23: 25 ug via INTRAVENOUS

## 2019-01-23 MED ORDER — MIDAZOLAM HCL 2 MG/2ML IJ SOLN
INTRAMUSCULAR | Status: AC
  Filled 2019-01-23: qty 2

## 2019-01-23 MED ORDER — CEFAZOLIN SODIUM-DEXTROSE 2-4 GM/100ML-% IV SOLN
2.0000 g | INTRAVENOUS | Status: AC
  Administered 2019-01-23: 2 g via INTRAVENOUS

## 2019-01-23 MED ORDER — FENTANYL CITRATE (PF) 100 MCG/2ML IJ SOLN
INTRAMUSCULAR | Status: AC
  Filled 2019-01-23: qty 2

## 2019-01-23 MED ORDER — ACETAMINOPHEN 500 MG PO TABS
ORAL_TABLET | ORAL | Status: AC
  Filled 2019-01-23: qty 2

## 2019-01-23 MED ORDER — CLONIDINE HCL (ANALGESIA) 100 MCG/ML EP SOLN
EPIDURAL | Status: DC | PRN
  Administered 2019-01-23: 100 ug

## 2019-01-23 MED ORDER — ACETAMINOPHEN 10 MG/ML IV SOLN: 1000.0000 mg | Freq: Once | INTRAVENOUS | Status: DC | PRN

## 2019-01-23 MED ORDER — VANCOMYCIN HCL 1000 MG IV SOLR
INTRAVENOUS | Status: AC
  Filled 2019-01-23: qty 1000

## 2019-01-23 MED ORDER — CEFAZOLIN SODIUM-DEXTROSE 2-4 GM/100ML-% IV SOLN
2.0000 g | INTRAVENOUS | Status: DC
  Filled 2019-01-23: qty 100

## 2019-01-23 MED ORDER — POVIDONE-IODINE 10 % EX SWAB: 2.0000 "application " | Freq: Once | CUTANEOUS | Status: DC

## 2019-01-23 MED ORDER — CHLORHEXIDINE GLUCONATE 4 % EX LIQD: 60.0000 mL | Freq: Once | CUTANEOUS | Status: DC

## 2019-01-23 MED ORDER — DEXMEDETOMIDINE HCL 200 MCG/2ML IV SOLN
INTRAVENOUS | Status: DC | PRN
  Administered 2019-01-23: 8 ug via INTRAVENOUS
  Administered 2019-01-23: 12 ug via INTRAVENOUS
  Administered 2019-01-23: 8 ug via INTRAVENOUS

## 2019-01-23 MED ORDER — ROPIVACAINE HCL 5 MG/ML IJ SOLN
INTRAMUSCULAR | Status: DC | PRN
  Administered 2019-01-23: 15 mL via PERINEURAL
  Administered 2019-01-23: 30 mL via PERINEURAL

## 2019-01-23 MED ORDER — MEPERIDINE HCL 25 MG/ML IJ SOLN: 6.2500 mg | INTRAMUSCULAR | Status: DC | PRN

## 2019-01-23 MED ORDER — MIDAZOLAM HCL 2 MG/2ML IJ SOLN
INTRAMUSCULAR | Status: DC | PRN
  Administered 2019-01-23: 2 mg via INTRAVENOUS

## 2019-01-23 MED ORDER — 0.9 % SODIUM CHLORIDE (POUR BTL) OPTIME
TOPICAL | Status: DC | PRN
  Administered 2019-01-23: 1000 mL

## 2019-01-23 MED ORDER — METHOCARBAMOL 750 MG PO TABS: 750.0000 mg | ORAL_TABLET | Freq: Four times a day (QID) | ORAL | 0 refills | Status: DC | PRN

## 2019-01-23 MED ORDER — VANCOMYCIN HCL 1000 MG IV SOLR
INTRAVENOUS | Status: DC | PRN
  Administered 2019-01-23: 1000 mg

## 2019-01-23 MED ORDER — PROPOFOL 10 MG/ML IV BOLUS
INTRAVENOUS | Status: DC | PRN
  Administered 2019-01-23: 160 mg via INTRAVENOUS

## 2019-01-23 MED ORDER — ASPIRIN EC 325 MG PO TBEC: 325.0000 mg | DELAYED_RELEASE_TABLET | Freq: Every day | ORAL | 0 refills | Status: AC

## 2019-01-23 MED ORDER — OXYCODONE-ACETAMINOPHEN 5-325 MG PO TABS: 1.0000 | ORAL_TABLET | ORAL | 0 refills | Status: DC | PRN

## 2019-01-23 MED ORDER — LIDOCAINE 2% (20 MG/ML) 5 ML SYRINGE
INTRAMUSCULAR | Status: DC | PRN
  Administered 2019-01-23: 20 mg via INTRAVENOUS

## 2019-01-23 MED ORDER — HYDROCODONE-ACETAMINOPHEN 7.5-325 MG PO TABS: 1.0000 | ORAL_TABLET | Freq: Once | ORAL | Status: DC | PRN

## 2019-01-23 MED ORDER — PROMETHAZINE HCL 25 MG/ML IJ SOLN: 6.2500 mg | INTRAMUSCULAR | Status: DC | PRN

## 2019-01-23 MED ORDER — ONDANSETRON HCL 4 MG/2ML IJ SOLN
INTRAMUSCULAR | Status: DC | PRN
  Administered 2019-01-23: 4 mg via INTRAVENOUS

## 2019-01-23 MED ORDER — HYDROMORPHONE HCL 1 MG/ML IJ SOLN: 0.2500 mg | INTRAMUSCULAR | Status: DC | PRN

## 2019-01-23 MED ORDER — LACTATED RINGERS IV SOLN
INTRAVENOUS | Status: DC | PRN
  Administered 2019-01-23: 07:00:00 via INTRAVENOUS

## 2019-01-23 MED ORDER — ACETAMINOPHEN 500 MG PO TABS
1000.0000 mg | ORAL_TABLET | Freq: Once | ORAL | Status: AC
  Administered 2019-01-23: 08:00:00 1000 mg via ORAL

## 2019-01-23 MED ORDER — DEXAMETHASONE SODIUM PHOSPHATE 10 MG/ML IJ SOLN
INTRAMUSCULAR | Status: DC | PRN
  Administered 2019-01-23: 10 mg via INTRAVENOUS

## 2019-01-23 SURGICAL SUPPLY — 60 items
BANDAGE ESMARK 6X9 LF (GAUZE/BANDAGES/DRESSINGS) ×1 IMPLANT
BIT DRILL 2.5 X LONG (BIT) ×1
BIT DRILL X LONG 2.5 (BIT) IMPLANT
BNDG COHESIVE 4X5 TAN STRL (GAUZE/BANDAGES/DRESSINGS) ×3 IMPLANT
BNDG ELASTIC 4X5.8 VLCR STR LF (GAUZE/BANDAGES/DRESSINGS) IMPLANT
BNDG ELASTIC 6X5.8 VLCR STR LF (GAUZE/BANDAGES/DRESSINGS) IMPLANT
BNDG ESMARK 6X9 LF (GAUZE/BANDAGES/DRESSINGS) ×3
BRUSH SCRUB EZ PLAIN DRY (MISCELLANEOUS) ×6 IMPLANT
CHLORAPREP W/TINT 26 (MISCELLANEOUS) ×3 IMPLANT
COVER SURGICAL LIGHT HANDLE (MISCELLANEOUS) ×3 IMPLANT
COVER WAND RF STERILE (DRAPES) ×3 IMPLANT
DRAPE C-ARM 42X72 X-RAY (DRAPES) ×3 IMPLANT
DRAPE C-ARMOR (DRAPES) ×3 IMPLANT
DRAPE ORTHO SPLIT 77X108 STRL (DRAPES) ×4
DRAPE SURG ORHT 6 SPLT 77X108 (DRAPES) ×2 IMPLANT
DRAPE U-SHAPE 47X51 STRL (DRAPES) ×3 IMPLANT
DRILL BIT X LONG 2.5 (BIT) ×2
DRSG ADAPTIC 3X8 NADH LF (GAUZE/BANDAGES/DRESSINGS) IMPLANT
ELECT REM PT RETURN 9FT ADLT (ELECTROSURGICAL) ×3
ELECTRODE REM PT RTRN 9FT ADLT (ELECTROSURGICAL) ×1 IMPLANT
GAUZE SPONGE 4X4 12PLY STRL (GAUZE/BANDAGES/DRESSINGS) ×2 IMPLANT
GLOVE BIO SURGEON STRL SZ 6.5 (GLOVE) ×6 IMPLANT
GLOVE BIO SURGEON STRL SZ7.5 (GLOVE) ×9 IMPLANT
GLOVE BIO SURGEONS STRL SZ 6.5 (GLOVE) ×3
GLOVE BIOGEL PI IND STRL 6.5 (GLOVE) ×1 IMPLANT
GLOVE BIOGEL PI IND STRL 7.5 (GLOVE) ×1 IMPLANT
GLOVE BIOGEL PI INDICATOR 6.5 (GLOVE) ×2
GLOVE BIOGEL PI INDICATOR 7.5 (GLOVE) ×2
GOWN STRL REUS W/ TWL LRG LVL3 (GOWN DISPOSABLE) ×2 IMPLANT
GOWN STRL REUS W/TWL LRG LVL3 (GOWN DISPOSABLE) ×4
KIT TURNOVER KIT B (KITS) ×3 IMPLANT
MANIFOLD NEPTUNE II (INSTRUMENTS) ×3 IMPLANT
NDL HYPO 21X1.5 SAFETY (NEEDLE) IMPLANT
NDL HYPO 25GX1X1/2 BEV (NEEDLE) ×1 IMPLANT
NEEDLE HYPO 21X1.5 SAFETY (NEEDLE) IMPLANT
NEEDLE HYPO 25GX1X1/2 BEV (NEEDLE) ×3 IMPLANT
NS IRRIG 1000ML POUR BTL (IV SOLUTION) ×3 IMPLANT
PACK TOTAL JOINT (CUSTOM PROCEDURE TRAY) ×3 IMPLANT
PAD ARMBOARD 7.5X6 YLW CONV (MISCELLANEOUS) ×6 IMPLANT
PAD CAST 4YDX4 CTTN HI CHSV (CAST SUPPLIES) IMPLANT
PADDING CAST COTTON 4X4 STRL (CAST SUPPLIES)
PADDING CAST COTTON 6X4 STRL (CAST SUPPLIES) IMPLANT
SCREW LP 3.5X70MM (Screw) ×2 IMPLANT
SCREW LP 3.5X75MM (Screw) ×2 IMPLANT
SPONGE LAP 18X18 RF (DISPOSABLE) IMPLANT
STAPLER VISISTAT 35W (STAPLE) ×3 IMPLANT
SUCTION FRAZIER HANDLE 10FR (MISCELLANEOUS) ×2
SUCTION TUBE FRAZIER 10FR DISP (MISCELLANEOUS) ×1 IMPLANT
SUT ETHILON 3 0 PS 1 (SUTURE) ×6 IMPLANT
SUT MNCRL AB 3-0 PS2 18 (SUTURE) ×2 IMPLANT
SUT PROLENE 0 CT (SUTURE) IMPLANT
SUT VIC AB 0 CT1 27 (SUTURE) ×2
SUT VIC AB 0 CT1 27XBRD ANBCTR (SUTURE) ×1 IMPLANT
SUT VIC AB 2-0 CT1 27 (SUTURE) ×2
SUT VIC AB 2-0 CT1 TAPERPNT 27 (SUTURE) ×2 IMPLANT
SYR CONTROL 10ML LL (SYRINGE) ×3 IMPLANT
TOWEL GREEN STERILE (TOWEL DISPOSABLE) ×6 IMPLANT
TOWEL GREEN STERILE FF (TOWEL DISPOSABLE) ×3 IMPLANT
UNDERPAD 30X30 (UNDERPADS AND DIAPERS) ×3 IMPLANT
WATER STERILE IRR 1000ML POUR (IV SOLUTION) ×3 IMPLANT

## 2019-01-23 NOTE — Anesthesia Procedure Notes (Signed)
Procedure Name: LMA Insertion Date/Time: 01/23/2019 8:41 AM Performed by: Cleda Daub, CRNA Pre-anesthesia Checklist: Patient identified, Emergency Drugs available, Suction available and Patient being monitored Patient Re-evaluated:Patient Re-evaluated prior to induction Oxygen Delivery Method: Circle system utilized Preoxygenation: Pre-oxygenation with 100% oxygen Induction Type: IV induction LMA: LMA inserted LMA Size: 4.5 Number of attempts: 1 Placement Confirmation: positive ETCO2 and breath sounds checked- equal and bilateral Tube secured with: Tape Dental Injury: Teeth and Oropharynx as per pre-operative assessment

## 2019-01-23 NOTE — Anesthesia Postprocedure Evaluation (Signed)
Anesthesia Post Note  Patient: Kendric Meloni  Procedure(s) Performed: OPEN REDUCTION INTERNAL FIXATION (ORIF) ANKLE FRACTURE (Right Ankle)     Patient location during evaluation: PACU Anesthesia Type: General Level of consciousness: awake and alert Pain management: pain level controlled Vital Signs Assessment: post-procedure vital signs reviewed and stable Respiratory status: spontaneous breathing, nonlabored ventilation, respiratory function stable and patient connected to nasal cannula oxygen Cardiovascular status: blood pressure returned to baseline and stable Postop Assessment: no apparent nausea or vomiting Anesthetic complications: no    Last Vitals:  Vitals:   01/23/19 0955 01/23/19 1010  BP: 107/64 107/68  Pulse: 61 65  Resp: 12 12  Temp: (!) 36.3 C   SpO2: 96% 93%    Last Pain:  Vitals:   01/23/19 0955  TempSrc:   PainSc: [place]

## 2019-01-23 NOTE — Op Note (Signed)
Orthopaedic Surgery Operative Note (CSN: [id number] ) Date of Surgery: 01/23/2019  Admit Date: 01/23/2019   Diagnoses: Pre-Op Diagnoses: Right bimalleolar ankle fracture   Post-Op Diagnosis: Same  Procedures: CPT 27814-Open reduction internal fixation of right bimalleolar fracture  Surgeons : Primary: Shona Needles, MD  Assistant: Patrecia Pace, PA-C  Location: OR 4   Anesthesia:General  Antibiotics: Ancef 2g preop   Tourniquet time:None  Estimated Blood VPXT:06 mL  Complications:None   Specimens:None   Implants: Implant Name Type Inv. Item Serial No. Manufacturer Lot No. LRB No. 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  Sustained a patellar dislocation along with a right bimalleolar ankle fracture.  X-ray showed a medial malleolus and small posterior malleolus avulsion.  He was splinted and I recommended proceeding with open reduction internal fixation.  Risks and benefits were discussed with the patient.  Risks include but not limited to bleeding, infection, malunion, nonunion, hardware failure, hardware irritation, nerve and blood vessel injury, ankle stiffness ankle arthritis, DVT, even possibility anesthetic complications.  He agreed to proceed with surgery and consent was obtained.  Operative Findings: 1.  Open reduction internal fixation of right bimalleolar ankle fracture using Synthes 3.5 mm low-profile screws in the medial malleolus. 2.  External rotation stress view under fluoroscopy showed stable ankle mortise with no need for posterior malleolus fixation or syndesmotic fixation.  Procedure: The patient was identified in the preoperative holding area. Consent was confirmed with the  patient and their family and all questions were answered. The operative extremity was marked after confirmation with the patient. he was then brought back to the operating room by our anesthesia colleagues.  He was carefully transferred over to radiolucent flat top table.  Was placed under general anesthetic.  Contralateral fluoroscopic imaging was obtained to show the fibula and syndesmosis for a comparison views.  The right lower extremity was then prepped and draped in usual sterile fashion.  A timeout was performed to verify the patient the procedure and the extremity.  Preoperative antibiotics were dosed.  I first started out with a medial approach to the medial malleolus.  I carried it through skin and subcutaneous tissue.  I carefully protected the neurovascular bundle.  A unicortical drill hole was made in the metaphysis a reduction tenaculum was used to reduce the medial malleolus anatomically.  I then drilled and placed 3.5 mm nonlocking screws across the medial malleolus fracture gaining bicortical fixation.  Fluoroscopic imaging was used to confirm adequate placement and a reduction of the fracture.   The posterior malleolus was a small avulsion fracture.  I did not feel that it required fixation however I wanted to assess the syndesmosis and make sure that this was not unstable.  With the ankle full dorsiflexion and external rotation stress view was obtained under live fluoroscopy.  There is no medial clear space widening.  As a result I felt that the syndesmosis was stable and with the fixation the medial malleolus the talus was reduced under the plafond.  I felt that no further fixation was needed.  Final fluoroscopic images were obtained.  The incision was copiously irrigated.  A gram of vancomycin powder was placed into the incision.  The skin was closed with 2-0 Vicryl and  3-0 Monocryl.  Steri-Strips were placed.  A sterile dressing was placed and a well-padded short leg splint was placed to  the lower extremity.  He was then awoken from anesthesia and taken the PACU in stable condition.  Post Op Plan/Instructions: Patient will be nonweightbearing to the right lower extremity.  He will return in 2 weeks for repeat x-rays of his ankle and knee.  He will be placed on aspirin 325 mg for DVT prophylaxis.  I was present and performed the entire surgery.  Ulyses Southward, PA-C did assist me throughout the case. An assistant was necessary given the difficulty in approach, maintenance of reduction and ability to instrument the fracture.   Truitt Merle, MD [facility]

## 2019-01-23 NOTE — Transfer of Care (Signed)
Immediate Anesthesia Transfer of Care Note  Patient: Daniel Nielsen  Procedure(s) Performed: OPEN REDUCTION INTERNAL FIXATION (ORIF) ANKLE FRACTURE (Right Ankle)  Patient Location: PACU  Anesthesia Type:GA combined with regional for post-op pain  Level of Consciousness: drowsy  Airway & Oxygen Therapy: Patient Spontanous Breathing and Patient connected to face mask oxygen  Post-op Assessment: Report given to RN and Post -op Vital signs reviewed and stable  Post vital signs: Reviewed and stable  Last Vitals:  Vitals Value Taken Time  BP 107/64 01/23/19 0955  Temp    Pulse 61 01/23/19 0956  Resp 12 01/23/19 0956  SpO2 95 % 01/23/19 0956  Vitals shown include unvalidated device data.  Last Pain:  Vitals:   01/23/19 0955  TempSrc:   PainSc: (P) Asleep      Patients Stated Pain Goal: 3 (18/29/93 7169)  Complications: No apparent anesthesia complications

## 2019-01-23 NOTE — Anesthesia Preprocedure Evaluation (Addendum)
Anesthesia Evaluation  Patient identified by MRN, date of birth, ID band Patient awake    Reviewed: Allergy & Precautions, NPO status , Patient's Chart, lab work & pertinent test results  Airway Mallampati: II  TM Distance: >3 FB Neck ROM: Full    Dental no notable dental hx. (+) Teeth Intact, Dental Advisory Given   Pulmonary Current Smoker and Patient abstained from smoking.,    Pulmonary exam normal breath sounds clear to auscultation       Cardiovascular Exercise Tolerance: Good negative cardio ROS Normal cardiovascular exam Rhythm:Regular Rate:Normal     Neuro/Psych negative neurological ROS  negative psych ROS   GI/Hepatic negative GI ROS, Neg liver ROS,   Endo/Other  Hypothyroidism   Renal/GU negative Renal ROS     Musculoskeletal  (+) Arthritis ,   Abdominal   Peds  Hematology   Anesthesia Other Findings   Reproductive/Obstetrics                            Anesthesia Physical Anesthesia Plan  ASA: II  Anesthesia Plan: General   Post-op Pain Management:  Regional for Post-op pain   Induction: Intravenous  PONV Risk Score and Plan: 3 and Treatment may vary due to age or medical condition, Ondansetron, Dexamethasone and Midazolam  Airway Management Planned: LMA  Additional Equipment:   Intra-op Plan:   Post-operative Plan:   Informed Consent: I have reviewed the patients History and Physical, chart, labs and discussed the procedure including the risks, benefits and alternatives for the proposed anesthesia with the patient or authorized representative who has indicated his/her understanding and acceptance.     Dental advisory given  Plan Discussed with:   Anesthesia Plan Comments:         Anesthesia Quick Evaluation

## 2019-01-23 NOTE — Discharge Instructions (Signed)
[facility] Discharge Instructions   General Discharge Instructions  WEIGHT BEARING STATUS: Non-weightbearing on right lower extremity  RANGE OF MOTION/ACTIVITY: Unrestricted knee range of motion. Do not remove splint  Wound Care: Do not remove splint or get splint wet  DVT/PE prophylaxis: Aspirin 325 mg daily  Diet: as you were eating previously.  Can use over the counter stool softeners and bowel preparations, such as Miralax, to help with bowel movements.  Narcotics can be constipating.  Be sure to drink plenty of fluids  PAIN MEDICATION USE AND EXPECTATIONS  You have likely been given narcotic medications to help control your pain.  After a traumatic event that results in an fracture (broken bone) with or without surgery, it is ok to use narcotic pain medications to help control one's pain.  We understand that everyone responds to pain differently and each individual patient will be evaluated on a regular basis for the continued need for narcotic medications. Ideally, narcotic medication use should last no more than 6-8 weeks (coinciding with fracture healing).   As a patient it is your responsibility as well to monitor narcotic medication use and report the amount and frequency you use these medications when you come to your office visit.   We would also advise that if you are using narcotic medications, you should take a dose prior to therapy to maximize you participation.  IF YOU ARE ON NARCOTIC MEDICATIONS IT IS NOT PERMISSIBLE TO OPERATE A MOTOR VEHICLE (MOTORCYCLE/CAR/TRUCK/MOPED) OR HEAVY MACHINERY DO NOT MIX NARCOTICS WITH OTHER CNS (CENTRAL NERVOUS SYSTEM) DEPRESSANTS SUCH AS ALCOHOL   STOP SMOKING OR USING NICOTINE PRODUCTS!!!!  As discussed nicotine severely impairs your body's ability to heal surgical and traumatic wounds but also impairs bone healing.  Wounds and bone heal by forming microscopic blood vessels (angiogenesis) and nicotine is a  vasoconstrictor (essentially, shrinks blood vessels).  Therefore, if vasoconstriction occurs to these microscopic blood vessels they essentially disappear and are unable to deliver necessary nutrients to the healing tissue.  This is one modifiable factor that you can do to dramatically increase your chances of healing your injury.    (This means no smoking, no nicotine gum, patches, etc)  DO NOT USE NONSTEROIDAL ANTI-INFLAMMATORY DRUGS (NSAID'S)  Using products such as Advil (ibuprofen), Aleve (naproxen), Motrin (ibuprofen) for additional pain control during fracture healing can delay and/or prevent the healing response.  If you would like to take over the counter (OTC) medication, Tylenol (acetaminophen) is ok.  However, some narcotic medications that are given for pain control contain acetaminophen as well. Therefore, you should not exceed more than 4000 mg of tylenol in a day if you do not have liver disease.  Also note that there are may OTC medicines, such as cold medicines and allergy medicines that my contain tylenol as well.  If you have any questions about medications and/or interactions please ask your doctor/PA or your pharmacist.      ICE AND ELEVATE INJURED/OPERATIVE EXTREMITY  Using ice and elevating the injured extremity above your heart can help with swelling and pain control.  Icing in a pulsatile fashion, such as 20 minutes on and 20 minutes off, can be followed.    Do not place ice directly on skin. Make sure there is a barrier between to skin and the ice pack.    Using frozen items such as frozen peas works well as the conform nicely to the are that needs to be iced.  USE AN ACE WRAP OR TED  HOSE FOR SWELLING CONTROL  In addition to icing and elevation, Ace wraps or TED hose are used to help limit and resolve swelling.  It is recommended to use Ace wraps or TED hose until you are informed to stop.    When using Ace Wraps start the wrapping distally (farthest away from the body) and  wrap proximally (closer to the body)   Example: If you had surgery on your leg or thing and you do not have a splint on, start the ace wrap at the toes and work your way up to the thigh        If you had surgery on your upper extremity and do not have a splint on, start the ace wrap at your fingers and work your way up to the upper arm  IF YOU ARE IN A SPLINT OR CAST DO NOT [place]   If your splint gets wet for any reason please contact the office immediately. You may shower in your splint or cast as long as you keep it dry.  This can be done by wrapping in a cast cover or garbage back (or similar)  Do Not stick any thing down your splint or cast such as pencils, money, or hangers to try and scratch yourself with.  If you feel itchy take benadryl as prescribed on the bottle for itching  IF YOU ARE IN A CAM BOOT (BLACK BOOT)  You may remove boot periodically. Perform daily dressing changes as noted below.  Wash the liner of the boot regularly and wear a sock when wearing the boot. It is recommended that you sleep in the boot until told otherwise   CALL THE OFFICE WITH ANY QUESTIONS OR CONCERNS: [phone]   VISIT OUR WEBSITE FOR ADDITIONAL INFORMATION: [email or web address]    Discharge Wound Care Instructions  Do NOT apply any ointments, solutions or lotions to pin sites or surgical wounds.  These prevent needed drainage and even though solutions like hydrogen peroxide kill bacteria, they also damage cells lining the pin sites that help fight infection.  Applying lotions or ointments can keep the wounds moist and can cause them to breakdown and open up as well. This can increase the risk for infection. When in doubt call the office.  Surgical incisions should be dressed daily.  If any drainage is noted, use one layer of adaptic, then gauze, Kerlix, and an ace wrap.  Once the incision is completely dry and without drainage, it may be left open to air out.  Showering may begin  36-48 hours later.  Cleaning gently with soap and water.  Traumatic wounds should be dressed daily as well.    One layer of adaptic, gauze, Kerlix, then ace wrap.  The adaptic can be discontinued once the draining has ceased    If you have a wet to dry dressing: wet the gauze with saline the squeeze as much saline out so the gauze is moist (not soaking wet), place moistened gauze over wound, then place a dry gauze over the moist one, followed by Kerlix wrap, then ace wrap.

## 2019-01-23 NOTE — Anesthesia Procedure Notes (Signed)
Anesthesia Regional Block: Popliteal block   Pre-Anesthetic Checklist: ,, timeout performed, Correct Patient, Correct Site, Correct Laterality, Correct Procedure, Correct Position, site marked, Risks and benefits discussed, pre-op evaluation,  At surgeon's request and post-op pain management  Laterality: Right  Prep: Maximum Sterile Barrier Precautions used, chloraprep       Needles:  Injection technique: Single-shot  Needle Type: Echogenic Needle     Needle Length: 9cm  Needle Gauge: 21       Additional Needles:   Procedures:,,,, ultrasound used (permanent image in chart),,,,  Narrative:  Start time: 01/23/2019 8:15 AM End time: 01/23/2019 8:22 AM Injection made incrementally with aspirations every 5 mL.  Performed by: Personally  Anesthesiologist: Barnet Glasgow, MD  Additional Notes: Block assessed. Patient tolerated procedure well.

## 2019-01-23 NOTE — Anesthesia Procedure Notes (Signed)
Anesthesia Regional Block: Adductor canal block   Pre-Anesthetic Checklist: ,, timeout performed, Correct Patient, Correct Site, Correct Laterality, Correct Procedure, Correct Position, site marked, Risks and benefits discussed,  Surgical consent,  Pre-op evaluation,  At surgeon's request and post-op pain management  Laterality: Lower and Right  Prep: chloraprep       Needles:  Injection technique: Single-shot  Needle Type: Echogenic Needle     Needle Length: 9cm  Needle Gauge: 22     Additional Needles:   Procedures:,,,, ultrasound used (permanent image in chart),,,,  Narrative:  Start time: 01/23/2019 8:26 AM End time: 01/23/2019 8:31 AM Injection made incrementally with aspirations every 5 mL.  Performed by: Personally  Anesthesiologist: Barnet Glasgow, MD  Additional Notes: Block assessed prior to surgery. Pt tolerated procedure well.

## 2019-01-23 NOTE — Interval H&P Note (Signed)
History and Physical Interval Note:  01/23/2019 7:58 AM  Daniel Nielsen  has presented today for surgery, with the diagnosis of Right ankle fx.  The various methods of treatment have been discussed with the patient and family. After consideration of risks, benefits and other options for treatment, the patient has consented to  Procedure(s): OPEN REDUCTION INTERNAL FIXATION (ORIF) ANKLE FRACTURE (Right) as a surgical intervention.  The patient's history has been reviewed, patient examined, no change in status, stable for surgery.  I have reviewed the patient's chart and labs.  Questions were answered to the patient's satisfaction.     Lennette Bihari P Zacharey Jensen

## 2019-01-26 ENCOUNTER — Encounter (HOSPITAL_COMMUNITY): Payer: Self-pay | Admitting: Student

## 2019-01-27 ENCOUNTER — Encounter: Payer: Self-pay | Admitting: Student

## 2019-01-27 DIAGNOSIS — S82841A Displaced bimalleolar fracture of right lower leg, initial encounter for closed fracture: Secondary | ICD-10-CM

## 2019-01-27 DIAGNOSIS — S83004A Unspecified dislocation of right patella, initial encounter: Secondary | ICD-10-CM | POA: Insufficient documentation

## 2019-01-27 HISTORY — DX: Unspecified dislocation of right patella, initial encounter: S83.004A

## 2019-01-27 HISTORY — DX: Displaced bimalleolar fracture of right lower leg, initial encounter for closed fracture: S82.841A

## 2019-05-22 ENCOUNTER — Ambulatory Visit: Payer: 59 | Admitting: Osteopathic Medicine

## 2019-06-25 DIAGNOSIS — F640 Transsexualism: Secondary | ICD-10-CM | POA: Insufficient documentation

## 2020-06-09 IMAGING — DX DG KNEE 1-2V PORT*R*
1 series · 1 of 1 positions shown · non-contrast
Comparison: Same day.

CLINICAL DATA: Status post reduction of patellar dislocation.

EXAM:
PORTABLE RIGHT KNEE - 1-2 VIEW

[knee ap]
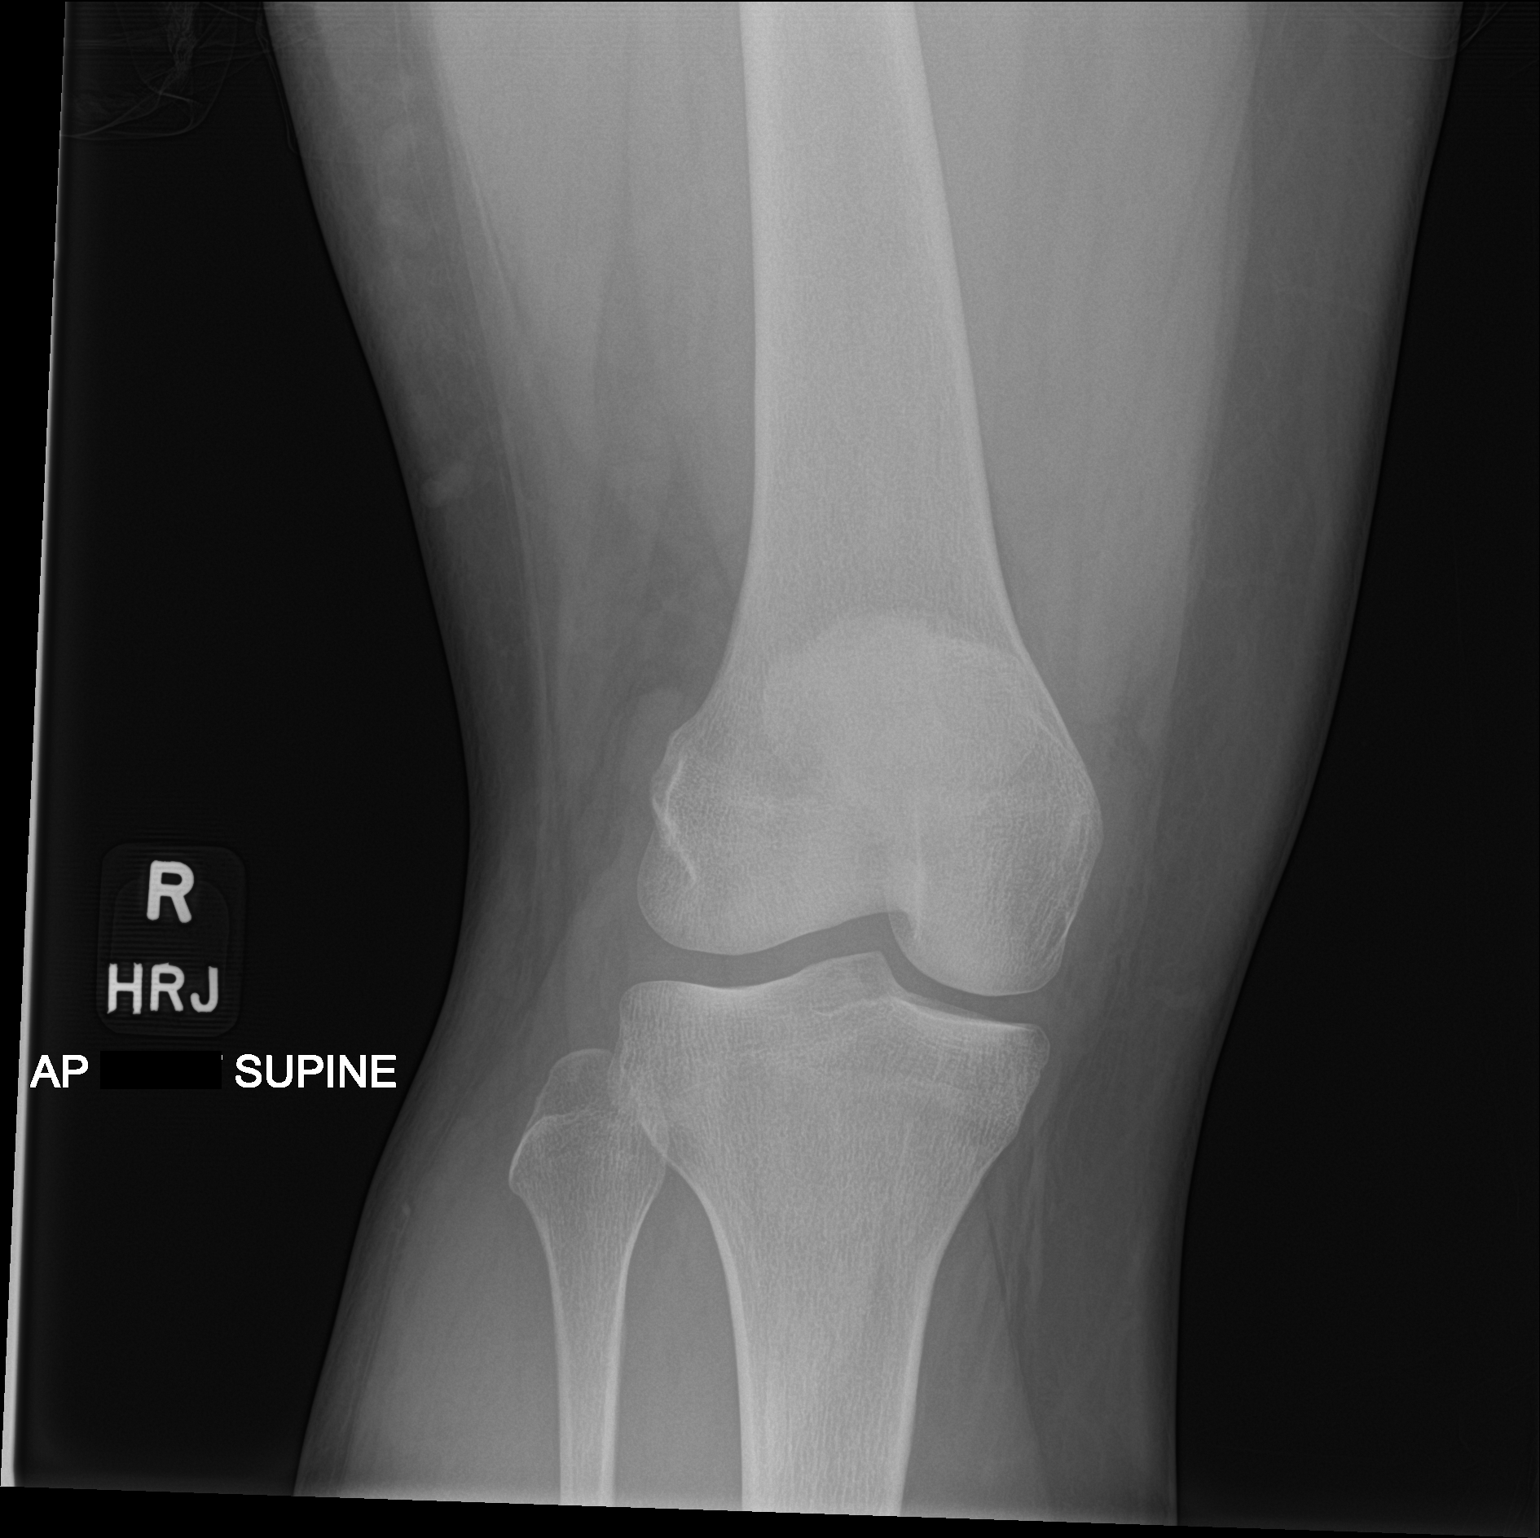

[1 of 1 positions shown; findings below may reference images not displayed]

FINDINGS: Lateral patellar dislocation noted on prior exam has been
successfully reduced. No fracture is noted. Joint spaces are intact.
IMPRESSION: Successful reduction of lateral patellar dislocation.

## 2020-06-11 IMAGING — DX DG ANKLE PORT 2V*R*
3 series · 3 of 3 positions shown · non-contrast
Comparison: 01/23/2019

CLINICAL DATA: Right ankle ORIF

EXAM:
PORTABLE RIGHT ANKLE - 2 VIEW

[ankle ap]
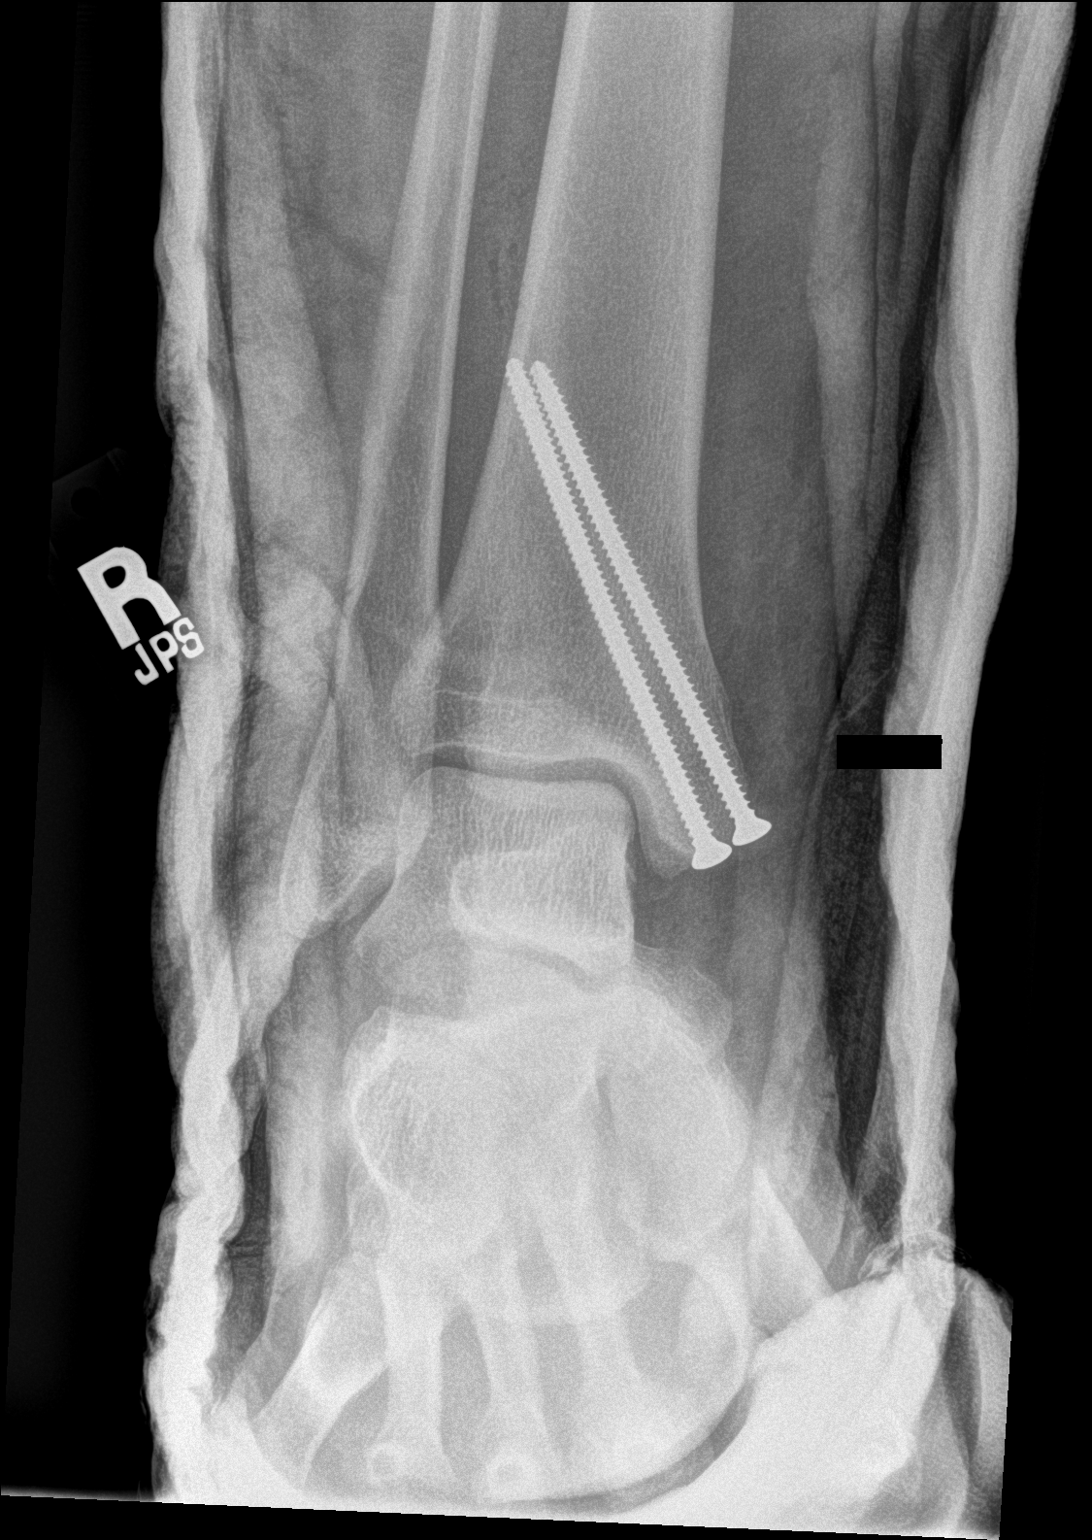

[ankle lat]
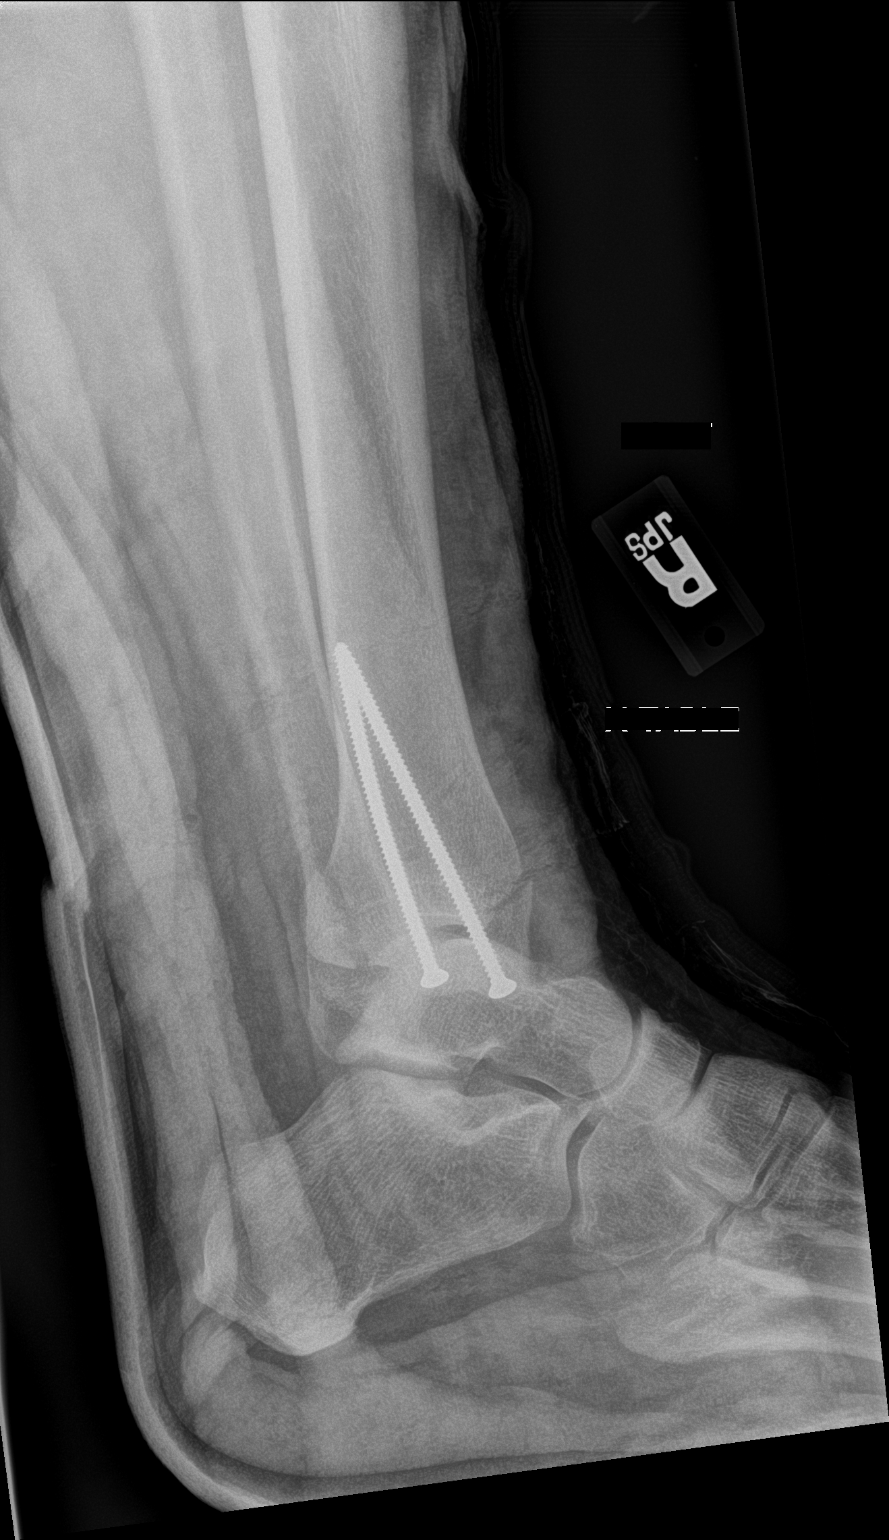

[ankle obl]
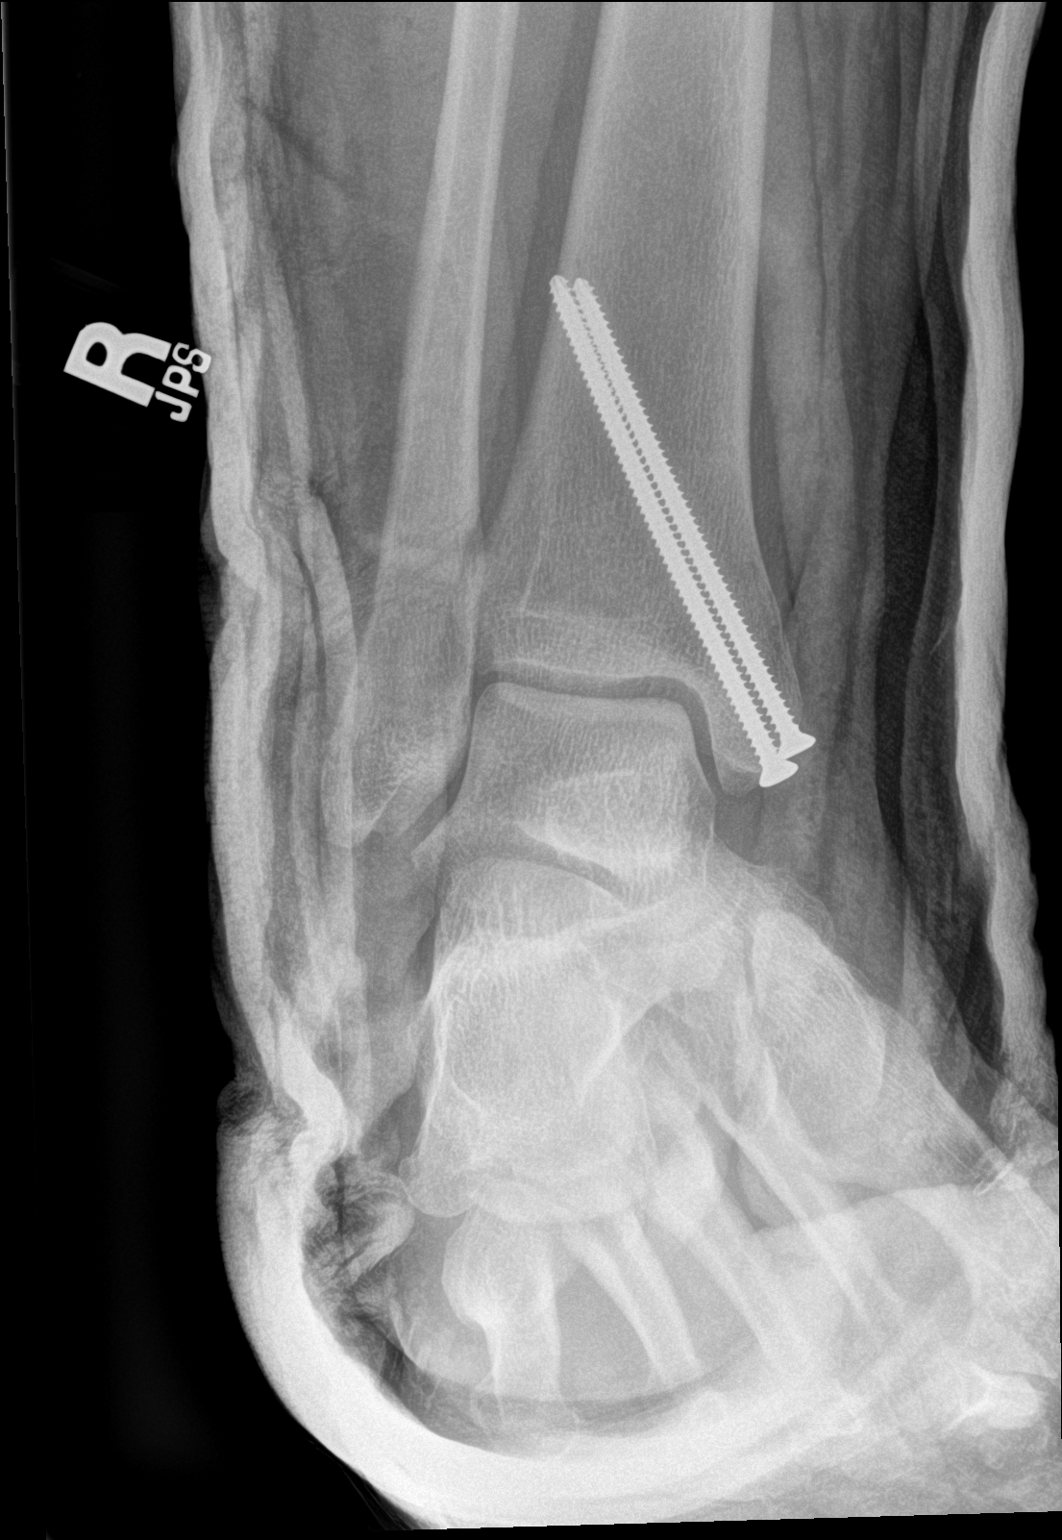

[3 of 3 positions shown; findings below may reference images not displayed]

FINDINGS: Medial malleolar fracture transfixed with 2 screws. Minimally
displaced posterior malleolar fracture. Oblique fracture without
displacement of the distal fibular metaphysis.

Casting material surrounding the ankle. No additional fracture or
dislocation. Soft tissue swelling around the ankle.
IMPRESSION: 1. Interval ORIF of a medial malleolar fracture in near anatomic
alignment.
2. Nondisplaced oblique fracture of the distal fibular metaphysis.
3. Minimally displaced posterior tibial malleolar fracture.

## 2022-10-29 ENCOUNTER — Encounter (HOSPITAL_BASED_OUTPATIENT_CLINIC_OR_DEPARTMENT_OTHER): Payer: Self-pay | Admitting: Family Medicine

## 2022-10-29 ENCOUNTER — Ambulatory Visit (INDEPENDENT_AMBULATORY_CARE_PROVIDER_SITE_OTHER): Payer: Managed Care, Other (non HMO) | Admitting: Family Medicine

## 2022-10-29 VITALS — BP 118/79 | HR 102 | Ht 68.0 in | Wt 215.9 lb

## 2022-10-29 DIAGNOSIS — Z7689 Persons encountering health services in other specified circumstances: Secondary | ICD-10-CM

## 2022-10-29 DIAGNOSIS — Z79890 Hormone replacement therapy (postmenopausal): Secondary | ICD-10-CM

## 2022-10-29 DIAGNOSIS — E200 Idiopathic hypoparathyroidism: Secondary | ICD-10-CM

## 2022-10-29 DIAGNOSIS — Z716 Tobacco abuse counseling: Secondary | ICD-10-CM | POA: Insufficient documentation

## 2022-10-29 DIAGNOSIS — Z789 Other specified health status: Secondary | ICD-10-CM | POA: Insufficient documentation

## 2022-10-29 DIAGNOSIS — Z720 Tobacco use: Secondary | ICD-10-CM | POA: Insufficient documentation

## 2022-10-29 HISTORY — DX: Tobacco abuse counseling: Z71.6

## 2022-10-29 MED ORDER — BUPROPION HCL ER (XL) 150 MG PO TB24: 150.0000 mg | ORAL_TABLET | Freq: Every day | ORAL | 1 refills | Status: DC

## 2022-10-29 MED ORDER — LEVOTHYROXINE SODIUM 200 MCG PO TABS: 200.0000 ug | ORAL_TABLET | Freq: Every day | ORAL | 1 refills | Status: DC

## 2022-10-29 NOTE — Progress Notes (Signed)
New Patient Office Visit  Subjective    Patient ID: Daniel Nielsen, adult    DOB: [birth year]  Age: 37 y.o. MRN: [id number]  CC:  Chief Complaint  Patient presents with   New Patient (Initial Visit)    Pt is here today to get established with the practice. Denies any concerns for today's visit.    HPI Daniel Nielsen is a pleasant 37 year-old transgender male who presents to establish care. He has a history of anxiety/depression and hypothyroidism. He reports he was on medication in the past for mood but has been off for about 7-8 years. He has been on thyroid meds for "a long time" and denies symptoms. He would like a referral to endocrinology for testosterone replacement.   Current smoker- hsa tried gum & transdermal patches in the past without success He is interested in quitting smoking and would like to discuss pharmacotherapy      10/29/2022    2:12 PM  GAD 7 : Generalized Anxiety Score  Nervous, Anxious, on Edge 0  Control/stop worrying 0  Worry too much - different things 0  Trouble relaxing 0  Restless 0  Easily annoyed or irritable 0  Afraid - awful might happen 0  Total GAD 7 Score 0  Anxiety Difficulty Not difficult at all       10/29/2022    2:12 PM  Depression screen PHQ 2/9  Decreased Interest 0  Down, Depressed, Hopeless 0  PHQ - 2 Score 0  Altered sleeping 0  Tired, decreased energy 1  Change in appetite 0  Feeling bad or failure about yourself  0  Trouble concentrating 0  Moving slowly or fidgety/restless 0  Suicidal thoughts 0  PHQ-9 Score 1  Difficult doing work/chores Somewhat difficult     Outpatient Encounter Medications as of 10/29/2022  Medication Sig   buPROPion (WELLBUTRIN XL) 150 MG 24 hr tablet Take 1 tablet (150 mg total) by mouth daily.   cetirizine (ZYRTEC) 10 MG tablet Take 10 mg by mouth daily.   testosterone cypionate (DEPOTESTOTERONE CYPIONATE) 100 MG/ML injection Inject into the muscle.   [DISCONTINUED] levothyroxine  (SYNTHROID) 200 MCG tablet Take by mouth.   levothyroxine (SYNTHROID) 200 MCG tablet Take 1 tablet (200 mcg total) by mouth daily before breakfast.   [DISCONTINUED] ARIPiprazole (ABILIFY) 5 MG tablet Take 5 mg by mouth daily.   [DISCONTINUED] escitalopram (LEXAPRO) 20 MG tablet Take 20 mg by mouth daily.    [DISCONTINUED] levothyroxine (SYNTHROID) 175 MCG tablet Take 1 tablet (175 mcg total) by mouth daily before breakfast.   [DISCONTINUED] methocarbamol (ROBAXIN-750) 750 MG tablet Take 1 tablet (750 mg total) by mouth every 6 (six) hours as needed for muscle spasms.   [DISCONTINUED] oxyCODONE-acetaminophen (PERCOCET/ROXICET) 5-325 MG tablet Take 1 tablet by mouth every 4 (four) hours as needed for severe pain.   [DISCONTINUED] testosterone cypionate (DEPOTESTOSTERONE CYPIONATE) 200 MG/ML injection Inject 0.5 mLs (100 mg total) into the muscle once a week. (Patient taking differently: Inject 100 mg into the muscle every Saturday. )   No facility-administered encounter medications on file as of 10/29/2022.    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    Hypothyroid    PTSD (post-traumatic stress disorder)     Past Surgical History:  Procedure Laterality Date   ABDOMINAL HYSTERECTOMY     2019   COSMETIC SURGERY     Double masectomy in 2016 and  hysterectomy/oophrectomy in 2019   FRACTURE SURGERY     ORIF ankle   MASTECTOMY  2016   ORIF ANKLE FRACTURE Right 01/23/2019   Procedure: OPEN REDUCTION INTERNAL FIXATION (ORIF) ANKLE FRACTURE;  Surgeon: Roby Lofts, MD;  Location: [facility] OR;  Service: Orthopedics;  Laterality: Right;    Family History  Problem Relation Age of Onset   Healthy Mother    Alcohol abuse Father    Testicular cancer Father    Drug abuse Father    Early death Father    Lung cancer Maternal Grandmother    Melanoma Maternal Grandfather    Lung cancer Paternal Grandmother    Alcohol  abuse Paternal Grandfather    Alcohol abuse Paternal Uncle    Drug abuse Paternal Uncle      Review of Systems  Constitutional:  Negative for chills, fever, malaise/fatigue and weight loss.  Eyes:  Negative for blurred vision and double vision.  Respiratory:  Negative for cough and shortness of breath.   Cardiovascular:  Negative for chest pain, palpitations and leg swelling.  Gastrointestinal:  Negative for abdominal pain, constipation, diarrhea, nausea and vomiting.  Genitourinary:  Negative for frequency and urgency.  Musculoskeletal:  Negative for myalgias.  Neurological:  Negative for dizziness, weakness and headaches.  Psychiatric/Behavioral:  Negative for depression and suicidal ideas. The patient is not nervous/anxious and does not have insomnia.         Objective    BP 118/79 (BP Location: Left Arm, Patient Position: Sitting, Cuff Size: Large)   Pulse (!) 102   Ht 5\' 8"  (1.727 m)   Wt 215 lb 14.4 oz (97.9 kg)   SpO2 98%   BMI 32.83 kg/m   Physical Exam Vitals reviewed.  Constitutional:      Appearance: Normal appearance.  Cardiovascular:     Rate and Rhythm: Normal rate and regular rhythm.     Pulses: Normal pulses.     Heart sounds: Normal heart sounds.  Pulmonary:     Effort: Pulmonary effort is normal.     Breath sounds: Normal breath sounds.  Neurological:     Mental Status: He is alert.  Psychiatric:        Mood and Affect: Mood normal.        Behavior: Behavior normal.        Thought Content: Thought content normal.        Judgment: Judgment normal.      Assessment & Plan:   1. Encounter to establish care Patient is a pleasant 37 year-old transgender male who is here to establish primary care. Reviewed the past medical history, family history, social history, surgical history, medications, and allergies today- updates made as indicated. He presents with concerns today regarding referral to endocrinology for long-term use of testosterone, smoking  cessation, and medical management of hypothyroidism.  - CBC with Differential/Platelet - Comprehensive metabolic panel - Hemoglobin A1c - TSH+T4F+T3Free  2. Encounter for smoking cessation counseling Patient reports he is a current smoker but interested in quitting. He reports that his current partner is pregnant and has been successful in quitting smoking, and now would like him to quit before their second child is born.   3. Tobacco use Discussed various methods for smoking cessation. Patient reports no success with previous methods, including nicotine gum and patches. Discussed options, patient would like to proceed with bupropion. Discussed choosing a target quit date within two weeks of starting medication. Plan to continue treatment for at least 12 weeks. Will follow-up  in 12 weeks to determine length of treatment.  - buPROPion (WELLBUTRIN XL) 150 MG 24 hr tablet; Take 1 tablet (150 mg total) by mouth daily.  Dispense: 90 tablet; Refill: 1  4. Male-to-male transgender person Reviewed surgical history with patient. Review of note- patient had a complete hysterectomy with bilateral salpingectomy oophorectomy and cystoscopy 11/2019. Discontinued cervical cancer screenings.   5. Long-term current use of testosterone replacement therapy Patient would like referral to endocrinology for long-term use of testosterone replacement therapy. He reports that he has 2 vials left. Will place urgent referral. Advised patient to contact office if he needs refill in meantime, prior to establishing with specialist.  - Ambulatory referral to Endocrinology  6. Idiopathic hypoparathyroidism (HCC) Patient reports he has been taking levothyroxine QAC. Will check thyroid function today and adjust medication as indicated.     Return in about 3 months (around 01/29/2023) for smoking cessation .   Alyson Reedy, FNP

## 2022-10-30 LAB — COMPREHENSIVE METABOLIC PANEL
ALT: 23 IU/L (ref 0–44)
AST: 16 IU/L (ref 0–40)
Albumin: 4.3 g/dL (ref 4.1–5.1)
Alkaline Phosphatase: 94 IU/L (ref 44–121)
BUN/Creatinine Ratio: 15 (ref 9–20)
BUN: 13 mg/dL (ref 6–20)
Bilirubin Total: 0.2 mg/dL (ref 0.0–1.2)
CO2: 23 mmol/L (ref 20–29)
Calcium: 9.4 mg/dL (ref 8.7–10.2)
Chloride: 107 mmol/L — ABNORMAL HIGH (ref 96–106)
Creatinine, Ser: 0.89 mg/dL (ref 0.76–1.27)
Globulin, Total: 2.7 g/dL (ref 1.5–4.5)
Glucose: 137 mg/dL — ABNORMAL HIGH (ref 70–99)
Potassium: 5.2 mmol/L (ref 3.5–5.2)
Sodium: 142 mmol/L (ref 134–144)
Total Protein: 7 g/dL (ref 6.0–8.5)
eGFR: 115 mL/min/{1.73_m2} (ref >59)

## 2022-10-30 LAB — CBC WITH DIFFERENTIAL/PLATELET
Basophils Absolute: 0 10*3/uL (ref 0.0–0.2)
Basos: 0 %
EOS (ABSOLUTE): 0.1 10*3/uL (ref 0.0–0.4)
Eos: 1 %
Hematocrit: 48.5 % (ref 37.5–51.0)
Hemoglobin: 16 g/dL (ref 13.0–17.7)
Immature Grans (Abs): 0 10*3/uL (ref 0.0–0.1)
Immature Granulocytes: 0 %
Lymphocytes Absolute: 1.9 10*3/uL (ref 0.7–3.1)
Lymphs: 29 %
MCH: 29.7 pg (ref 26.6–33.0)
MCHC: 33 g/dL (ref 31.5–35.7)
MCV: 90 fL (ref 79–97)
Monocytes Absolute: 0.5 10*3/uL (ref 0.1–0.9)
Monocytes: 8 %
Neutrophils Absolute: 4.1 10*3/uL (ref 1.4–7.0)
Neutrophils: 62 %
Platelets: 252 10*3/uL (ref 150–450)
RBC: 5.39 x10E6/uL (ref 4.14–5.80)
RDW: 11.3 % — ABNORMAL LOW (ref 11.6–15.4)
WBC: 6.6 10*3/uL (ref 3.4–10.8)

## 2022-10-30 LAB — TSH+T4F+T3FREE
Free T4: 3.42 ng/dL — ABNORMAL HIGH (ref 0.82–1.77)
T3, Free: 6 pg/mL — ABNORMAL HIGH (ref 2.0–4.4)
TSH: 0.033 u[IU]/mL — ABNORMAL LOW (ref 0.450–4.500)

## 2022-10-30 LAB — HEMOGLOBIN A1C
Est. average glucose Bld gHb Est-mCnc: 160 mg/dL
Hgb A1c MFr Bld: 7.2 % — ABNORMAL HIGH (ref 4.8–5.6)

## 2022-11-08 ENCOUNTER — Other Ambulatory Visit (HOSPITAL_BASED_OUTPATIENT_CLINIC_OR_DEPARTMENT_OTHER): Payer: Self-pay | Admitting: Family Medicine

## 2022-11-08 DIAGNOSIS — R7303 Prediabetes: Secondary | ICD-10-CM

## 2022-11-08 DIAGNOSIS — E200 Idiopathic hypoparathyroidism: Secondary | ICD-10-CM

## 2022-11-08 MED ORDER — LEVOTHYROXINE SODIUM 175 MCG PO TABS: 175.0000 ug | ORAL_TABLET | Freq: Every day | ORAL | 2 refills | Status: DC

## 2022-11-08 MED ORDER — METFORMIN HCL ER 500 MG PO TB24: 500.0000 mg | ORAL_TABLET | Freq: Every day | ORAL | 2 refills | Status: DC

## 2022-11-19 ENCOUNTER — Telehealth (HOSPITAL_BASED_OUTPATIENT_CLINIC_OR_DEPARTMENT_OTHER): Payer: Self-pay

## 2022-11-19 NOTE — Telephone Encounter (Signed)
Attempted to reach patient regarding referral, unable to reach. Will try again

## 2023-01-04 ENCOUNTER — Encounter (HOSPITAL_BASED_OUTPATIENT_CLINIC_OR_DEPARTMENT_OTHER): Payer: Self-pay | Admitting: Family Medicine

## 2023-01-07 ENCOUNTER — Other Ambulatory Visit (HOSPITAL_BASED_OUTPATIENT_CLINIC_OR_DEPARTMENT_OTHER): Payer: Self-pay | Admitting: Family Medicine

## 2023-01-07 DIAGNOSIS — E119 Type 2 diabetes mellitus without complications: Secondary | ICD-10-CM

## 2023-01-07 MED ORDER — TIRZEPATIDE 2.5 MG/0.5ML ~~LOC~~ SOAJ: 2.5000 mg | SUBCUTANEOUS | 2 refills | Status: DC

## 2023-01-24 ENCOUNTER — Encounter (HOSPITAL_BASED_OUTPATIENT_CLINIC_OR_DEPARTMENT_OTHER): Payer: Self-pay | Admitting: Family Medicine

## 2023-01-28 ENCOUNTER — Ambulatory Visit (HOSPITAL_BASED_OUTPATIENT_CLINIC_OR_DEPARTMENT_OTHER): Payer: Managed Care, Other (non HMO) | Admitting: Family Medicine

## 2023-02-04 ENCOUNTER — Encounter (HOSPITAL_BASED_OUTPATIENT_CLINIC_OR_DEPARTMENT_OTHER): Payer: Self-pay | Admitting: *Deleted

## 2023-02-05 ENCOUNTER — Encounter (HOSPITAL_BASED_OUTPATIENT_CLINIC_OR_DEPARTMENT_OTHER): Payer: Self-pay | Admitting: Family Medicine

## 2023-02-05 DIAGNOSIS — E119 Type 2 diabetes mellitus without complications: Secondary | ICD-10-CM

## 2023-02-06 MED ORDER — TIRZEPATIDE 5 MG/0.5ML ~~LOC~~ SOAJ: 5.0000 mg | SUBCUTANEOUS | 1 refills | Status: DC

## 2023-02-06 NOTE — Telephone Encounter (Signed)
Prior authorization was initiated. Still awaiting response for this.

## 2023-02-11 ENCOUNTER — Encounter (HOSPITAL_BASED_OUTPATIENT_CLINIC_OR_DEPARTMENT_OTHER): Payer: Self-pay | Admitting: Family Medicine

## 2023-02-11 ENCOUNTER — Ambulatory Visit (HOSPITAL_BASED_OUTPATIENT_CLINIC_OR_DEPARTMENT_OTHER): Payer: Managed Care, Other (non HMO) | Admitting: Family Medicine

## 2023-02-11 VITALS — BP 122/87 | HR 105 | Ht 68.0 in | Wt 213.3 lb

## 2023-02-11 DIAGNOSIS — E039 Hypothyroidism, unspecified: Secondary | ICD-10-CM | POA: Diagnosis not present

## 2023-02-11 DIAGNOSIS — E119 Type 2 diabetes mellitus without complications: Secondary | ICD-10-CM | POA: Diagnosis not present

## 2023-02-11 LAB — POCT GLYCOSYLATED HEMOGLOBIN (HGB A1C): Hemoglobin A1C: 6.8 % — AB (ref 4.0–5.6)

## 2023-02-11 NOTE — Progress Notes (Signed)
Established Patient Office Visit  Subjective   Patient ID: Daniel Nielsen, adult    DOB: January 01, 1986  Age: 37 y.o. MRN: [id number]  DIABETES MELLITUS: Daniel Nielsen is a 37 year old male patient who presents for the medical management of diabetes.  Current diabetes medication regimen: Mounjaro 5mg  weekly, took first initial dose of 5mg  last Friday  Yesterday, had heart palpitations- reports his heart rate got up to the 130s while at rest, just lying in bed  Denies polydipsia, polyphagia, polyuria, open wounds or ulcers on feet.   Lab Results  Component Value Date   HGBA1C 6.8 (A) 02/11/2023    No foot exam found No results found for: "LABMICR", "MICROALBUR"  Wt Readings from Last 3 Encounters:  02/11/23 213 lb 4.8 oz (96.8 kg)  10/29/22 215 lb 14.4 oz (97.9 kg)  01/23/19 235 lb (106.6 kg)    Review of Systems  Constitutional:  Negative for malaise/fatigue.  HENT:  Negative for ear pain and tinnitus.   Eyes:  Negative for blurred vision and double vision.  Respiratory:  Negative for cough and shortness of breath.   Cardiovascular:  Negative for chest pain and palpitations.  Gastrointestinal:  Negative for abdominal pain, nausea and vomiting.  Genitourinary:  Negative for frequency and urgency.  Musculoskeletal:  Negative for myalgias.  Neurological:  Negative for dizziness, weakness and headaches.  Psychiatric/Behavioral:  Negative for depression and suicidal ideas. The patient is not nervous/anxious.       Objective:     BP 122/87   Pulse (!) 105   Ht 5\' 8"  (1.727 m)   Wt 213 lb 4.8 oz (96.8 kg)   SpO2 99%   BMI 32.43 kg/m  BP Readings from Last 3 Encounters:  02/11/23 122/87  10/29/22 118/79  01/23/19 99/67      Physical Exam Vitals reviewed.  Constitutional:      Appearance: Normal appearance.  Cardiovascular:     Rate and Rhythm: Normal rate and regular rhythm.     Pulses: Normal pulses.     Heart sounds: Normal heart sounds.  Pulmonary:      Effort: Pulmonary effort is normal.     Breath sounds: Normal breath sounds.  Neurological:     Mental Status: He is alert.  Psychiatric:        Mood and Affect: Mood normal.        Behavior: Behavior normal.       Assessment & Plan:   1. Type 2 diabetes mellitus without complication, without long-term current use of insulin [facility]) Patient presents for medical management of type 2 diabetes mellitus. Patient was initially started on metformin but noticed an increase in appetite and weight gain. He was interested in [place] and medication was started due to initial hemoglobin A1c being 7.2%. Patient reports tolerating 5mg  weekly. He did note that he had an episode of tachycardia and heart palpitations last night while lying in bed. He reports symptoms subsided without any interventions but wanted to mention this at today's visit. POCT hemoglobin A1c completed in office today with results of 6.8%. Continue with Mounjaro. Will assess for electrolyte abnormalities and assess thyroid function today. Urine microalbumin/creatinine ratio completed today.  - Urine Albumin/Creatinine with ratio (send out) [LAB689] - POCT HgB A1C - Basic Metabolic Panel (BMET)  2. Acquired hypothyroidism Patient recently had an episode of tachycardia with palpitations. Noted after he took 5mg  of Mounjaro, which he just started on 12/6. Will check thyroid function today and make adjustments to medication if needed.  -  TSH Rfx on Abnormal to Free T4   Return in about 3 months (around 05/12/2023) for Diabetes f/u.    Alyson Reedy, FNP

## 2023-02-11 NOTE — Patient Instructions (Signed)

## 2023-02-12 ENCOUNTER — Other Ambulatory Visit (HOSPITAL_BASED_OUTPATIENT_CLINIC_OR_DEPARTMENT_OTHER): Payer: Self-pay | Admitting: Family Medicine

## 2023-02-12 DIAGNOSIS — E0590 Thyrotoxicosis, unspecified without thyrotoxic crisis or storm: Secondary | ICD-10-CM

## 2023-02-12 DIAGNOSIS — E200 Idiopathic hypoparathyroidism: Secondary | ICD-10-CM

## 2023-02-12 LAB — MICROALBUMIN / CREATININE URINE RATIO
Creatinine, Urine: 276.5 mg/dL
Microalb/Creat Ratio: 4 mg/g{creat} (ref 0–29)
Microalbumin, Urine: 11.5 ug/mL

## 2023-02-12 LAB — BASIC METABOLIC PANEL
BUN/Creatinine Ratio: 18 (ref 9–20)
BUN: 19 mg/dL (ref 6–20)
CO2: 22 mmol/L (ref 20–29)
Calcium: 9.5 mg/dL (ref 8.7–10.2)
Chloride: 105 mmol/L (ref 96–106)
Creatinine, Ser: 1.03 mg/dL (ref 0.76–1.27)
Glucose: 92 mg/dL (ref 70–99)
Potassium: 4.6 mmol/L (ref 3.5–5.2)
Sodium: 139 mmol/L (ref 134–144)
eGFR: 97 mL/min/{1.73_m2} (ref >59)

## 2023-02-12 LAB — TSH RFX ON ABNORMAL TO FREE T4: TSH: 0.028 u[IU]/mL — ABNORMAL LOW (ref 0.450–4.500)

## 2023-02-12 LAB — T4F: T4,Free (Direct): 2.35 ng/dL — ABNORMAL HIGH (ref 0.82–1.77)

## 2023-02-12 MED ORDER — LEVOTHYROXINE SODIUM 150 MCG PO TABS: 150.0000 ug | ORAL_TABLET | Freq: Every day | ORAL | 0 refills | Status: DC

## 2023-02-13 ENCOUNTER — Telehealth (HOSPITAL_BASED_OUTPATIENT_CLINIC_OR_DEPARTMENT_OTHER): Payer: Self-pay | Admitting: Family Medicine

## 2023-02-13 NOTE — Telephone Encounter (Signed)
Spoke with patients pharmacy, they state he has picked up the medication as of 12/05. They are unsure what patient is needing, I have attempted to reach patient 3x LVM for him to return call if needed.

## 2023-02-13 NOTE — Telephone Encounter (Signed)
See documentation.

## 2023-02-13 NOTE — Telephone Encounter (Signed)
Patient checking on the status of his authorization for mounjaro.

## 2023-02-13 NOTE — Telephone Encounter (Signed)
error 

## 2023-02-26 ENCOUNTER — Encounter (HOSPITAL_BASED_OUTPATIENT_CLINIC_OR_DEPARTMENT_OTHER): Payer: Self-pay | Admitting: Family Medicine

## 2023-02-26 DIAGNOSIS — E119 Type 2 diabetes mellitus without complications: Secondary | ICD-10-CM

## 2023-02-28 ENCOUNTER — Other Ambulatory Visit (HOSPITAL_BASED_OUTPATIENT_CLINIC_OR_DEPARTMENT_OTHER): Payer: Self-pay | Admitting: Family Medicine

## 2023-02-28 DIAGNOSIS — E119 Type 2 diabetes mellitus without complications: Secondary | ICD-10-CM

## 2023-02-28 MED ORDER — TIRZEPATIDE 7.5 MG/0.5ML ~~LOC~~ SOAJ: 7.5000 mg | SUBCUTANEOUS | 1 refills | Status: DC

## 2023-03-04 ENCOUNTER — Encounter (HOSPITAL_BASED_OUTPATIENT_CLINIC_OR_DEPARTMENT_OTHER): Payer: Self-pay

## 2023-03-04 DIAGNOSIS — E119 Type 2 diabetes mellitus without complications: Secondary | ICD-10-CM | POA: Insufficient documentation

## 2023-03-04 HISTORY — DX: Type 2 diabetes mellitus without complications: E11.9

## 2023-03-05 ENCOUNTER — Telehealth (HOSPITAL_BASED_OUTPATIENT_CLINIC_OR_DEPARTMENT_OTHER): Payer: Self-pay

## 2023-03-05 NOTE — Telephone Encounter (Signed)
 Spoke with patient in regards to medication, initial PA was denied unsure why because patient is a diabetic, let patient know I am in the process of submitting another PA hopefully this one will be approved or we can try to do an appeal. Prior PA for initial dose was approved, unsure why the next dose is being denied. Patient gave a verbal understanding, will keep him updated as we get updates.  Copied from CRM [id number]. Topic: Clinical - Prescription Issue >> Mar 05, 2023  9:55 AM Daniel Nielsen wrote: Reason for CRM: tried to fill tirzepatide  (MOUNJARO ) 7.5 MG/0.5ML Pen but the insurance wants prior authorization.  Please call [phone]

## 2023-03-07 ENCOUNTER — Telehealth (HOSPITAL_BASED_OUTPATIENT_CLINIC_OR_DEPARTMENT_OTHER): Payer: Self-pay

## 2023-03-07 NOTE — Telephone Encounter (Signed)
 Called patient in regards to PA for mounjaro , PA has been denied twice called [employer] to initiate an appeal for the medication. [employer] states patients insurance has been terminated as of 03/05/23 so I am unable to do an appeal. Patient gave a verbal understanding, he will reach out to the insurance company to get updated information to us  to hopefully initiate an appeal for this medication.

## 2023-03-08 ENCOUNTER — Encounter (HOSPITAL_BASED_OUTPATIENT_CLINIC_OR_DEPARTMENT_OTHER): Payer: Self-pay | Admitting: Family Medicine

## 2023-03-12 MED ORDER — OZEMPIC (0.25 OR 0.5 MG/DOSE) 2 MG/3ML ~~LOC~~ SOPN: 0.2500 mg | PEN_INJECTOR | SUBCUTANEOUS | 1 refills | Status: DC

## 2023-03-18 ENCOUNTER — Other Ambulatory Visit (HOSPITAL_BASED_OUTPATIENT_CLINIC_OR_DEPARTMENT_OTHER): Payer: Self-pay | Admitting: Family Medicine

## 2023-03-18 DIAGNOSIS — E200 Idiopathic hypoparathyroidism: Secondary | ICD-10-CM

## 2023-03-20 ENCOUNTER — Encounter: Payer: Self-pay | Admitting: Family Medicine

## 2023-03-21 ENCOUNTER — Other Ambulatory Visit: Payer: Self-pay | Admitting: Family Medicine

## 2023-03-21 DIAGNOSIS — E0590 Thyrotoxicosis, unspecified without thyrotoxic crisis or storm: Secondary | ICD-10-CM

## 2023-03-26 ENCOUNTER — Other Ambulatory Visit: Payer: Self-pay | Admitting: Family Medicine

## 2023-03-26 ENCOUNTER — Encounter: Payer: Self-pay | Admitting: Family Medicine

## 2023-03-26 DIAGNOSIS — E200 Idiopathic hypoparathyroidism: Secondary | ICD-10-CM

## 2023-03-26 DIAGNOSIS — E0580 Other thyrotoxicosis without thyrotoxic crisis or storm: Secondary | ICD-10-CM

## 2023-03-26 DIAGNOSIS — Z789 Other specified health status: Secondary | ICD-10-CM

## 2023-03-26 LAB — TSH RFX ON ABNORMAL TO FREE T4: TSH: 0.019 u[IU]/mL — ABNORMAL LOW (ref 0.450–4.500)

## 2023-03-26 LAB — T4F: T4,Free (Direct): 2.26 ng/dL — ABNORMAL HIGH (ref 0.82–1.77)

## 2023-03-26 MED ORDER — LEVOTHYROXINE SODIUM 125 MCG PO TABS: 125.0000 ug | ORAL_TABLET | Freq: Every day | ORAL | 3 refills | Status: DC

## 2023-04-10 LAB — HM DIABETES EYE EXAM

## 2023-04-30 ENCOUNTER — Other Ambulatory Visit (HOSPITAL_BASED_OUTPATIENT_CLINIC_OR_DEPARTMENT_OTHER): Payer: Self-pay | Admitting: Family Medicine

## 2023-04-30 ENCOUNTER — Encounter (HOSPITAL_BASED_OUTPATIENT_CLINIC_OR_DEPARTMENT_OTHER): Payer: Self-pay | Admitting: Family Medicine

## 2023-04-30 DIAGNOSIS — E119 Type 2 diabetes mellitus without complications: Secondary | ICD-10-CM

## 2023-04-30 MED ORDER — TIRZEPATIDE 2.5 MG/0.5ML ~~LOC~~ SOAJ: 2.5000 mg | SUBCUTANEOUS | 0 refills | Status: DC

## 2023-05-05 ENCOUNTER — Other Ambulatory Visit (HOSPITAL_BASED_OUTPATIENT_CLINIC_OR_DEPARTMENT_OTHER): Payer: Self-pay | Admitting: Family Medicine

## 2023-05-05 DIAGNOSIS — Z720 Tobacco use: Secondary | ICD-10-CM

## 2023-05-13 ENCOUNTER — Encounter (HOSPITAL_BASED_OUTPATIENT_CLINIC_OR_DEPARTMENT_OTHER): Payer: Self-pay | Admitting: Family Medicine

## 2023-05-13 ENCOUNTER — Ambulatory Visit (HOSPITAL_BASED_OUTPATIENT_CLINIC_OR_DEPARTMENT_OTHER): Payer: Managed Care, Other (non HMO) | Admitting: Family Medicine

## 2023-05-13 ENCOUNTER — Ambulatory Visit (INDEPENDENT_AMBULATORY_CARE_PROVIDER_SITE_OTHER): Payer: Managed Care, Other (non HMO) | Admitting: Family Medicine

## 2023-05-13 VITALS — BP 125/86 | HR 86 | Ht 68.0 in | Wt 194.0 lb

## 2023-05-13 DIAGNOSIS — E119 Type 2 diabetes mellitus without complications: Secondary | ICD-10-CM | POA: Diagnosis not present

## 2023-05-13 DIAGNOSIS — E039 Hypothyroidism, unspecified: Secondary | ICD-10-CM | POA: Diagnosis not present

## 2023-05-13 MED ORDER — TIRZEPATIDE 2.5 MG/0.5ML ~~LOC~~ SOAJ: 2.5000 mg | SUBCUTANEOUS | 0 refills | Status: DC

## 2023-05-13 NOTE — Progress Notes (Signed)
 Subjective:   Daniel Nielsen 31-Jan-1986 05/13/2023  Chief Complaint  Patient presents with   Diabetes    97-month follow up; has concerns about medication he is on for diabetes as was on Mounjaro prior but is now on Ozempic which is not working as well as the [employer].    HPI: Daniel Nielsen presents today for re-assessment and management of chronic medical conditions.  DIABETES MELLITUS: Daniel Nielsen presents for the medical management of diabetes.  Current diabetes medication regimen: Ozempic 0.5mg  weekly.  Patient would like to change to [facility] as he reports better weight management and improvement of A1C with Mounjaro. Will attempt prior authorization and transition patient if possible.  Patient is  adhering to a diabetic diet.  Patient is  exercising regularly.  Patient is not checking BS regularly.  Patient is  checking their feet regularly.  Denies polydipsia, polyphagia, polyuria, open wounds or ulcers on feet.   Lab Results  Component Value Date   HGBA1C 6.8 (A) 02/11/2023    Foot Exam: 05/13/2023 Lab Results  Component Value Date   LABMICR 11.5 02/11/2023    Wt Readings from Last 3 Encounters:  05/13/23 194 lb (88 kg)  02/11/23 213 lb 4.8 oz (96.8 kg)  10/29/22 215 lb 14.4 oz (97.9 kg)    HYPOTHYROIDISM: Patient presents for the medical management of Hypothyroidism. Current medication: Levothyroxine (reduced in January 2025 from )  Patient compliant with medication regimen: yes  Fatigue: no Cold intolerance: no Weight gain/loss: no Constipation: no Lower extremity edema: no Palpitations: no Hoarseness: no Neck Pain/Compression: no Difficulty Swallowing: no  Lab Results  Component Value Date   TSH 0.019 (L) 03/25/2023     The following portions of the patient's history were reviewed and updated as appropriate: past medical history, past surgical history, family history, social history, allergies, medications, and problem  list.   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   Acquired hypothyroidism 06/12/2018   PTSD (post-traumatic stress disorder) 06/12/2018   Smoker 06/12/2018   Transgender 06/12/2018   Past Medical History:  Diagnosis Date   Anxiety    Bimalleolar ankle fracture, right, closed, initial encounter 01/27/2019   Closed dislocation of right patella 01/27/2019   Depression    Diabetes mellitus without complication (HCC) 03/04/2023   Per PCP Alyson Reedy FNP-C   Encounter for smoking cessation counseling 10/29/2022   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    Hypothyroid    PTSD (post-traumatic stress disorder)    Past Surgical History:  Procedure Laterality Date   COSMETIC SURGERY     Double masectomy in 2016 and hysterectomy/oophrectomy in 2019   FRACTURE SURGERY     ORIF ankle   MASTECTOMY  2016   ORIF ANKLE FRACTURE Right 01/23/2019   Procedure: OPEN REDUCTION INTERNAL FIXATION (ORIF) ANKLE FRACTURE;  Surgeon: Roby Lofts, MD;  Location: [facility] OR;  Service: Orthopedics;  Laterality: Right;   TOTAL ABDOMINAL HYSTERECTOMY W/ BILATERAL SALPINGOOPHORECTOMY     2019   Family History  Problem Relation Age of Onset   Healthy Mother    Alcohol abuse Father    Testicular cancer Father    Drug abuse Father    Early death Father  Lung cancer Maternal Grandmother    Melanoma Maternal Grandfather    Lung cancer Paternal Grandmother    Alcohol abuse Paternal Grandfather    Alcohol abuse Paternal Uncle    Drug abuse Paternal Uncle    Outpatient Medications Prior to Visit  Medication Sig Dispense Refill   buPROPion (WELLBUTRIN XL) 300 MG 24 hr tablet Take 300 mg by  mouth daily.     cetirizine (ZYRTEC) 10 MG tablet Take 10 mg by mouth daily.     levothyroxine (SYNTHROID) 125 MCG tablet Take 1 tablet (125 mcg total) by mouth daily before breakfast. 30 tablet 3   testosterone cypionate (DEPOTESTOTERONE CYPIONATE) 100 MG/ML injection Inject into the muscle.     Semaglutide (OZEMPIC, 1 MG/DOSE, [place]) Inject 0.5 mg into the skin once a week.     buPROPion (WELLBUTRIN XL) 150 MG 24 hr tablet TAKE 1 TABLET BY MOUTH EVERY DAY 90 tablet 1   tirzepatide (MOUNJARO) 2.5 MG/0.5ML Pen Inject 2.5 mg into the skin once a week. (Patient not taking: Reported on 05/13/2023) 2 mL 0   No facility-administered medications prior to visit.   No Known Allergies   ROS: A complete ROS was performed with pertinent positives/negatives noted in the HPI. The remainder of the ROS are negative.    Objective:   Today's Vitals   05/13/23 1533  BP: 125/86  Pulse: 86  SpO2: 99%  Weight: 194 lb (88 kg)  Height: 5\' 8"  (1.727 m)    Physical Exam          GENERAL: Well-appearing, in NAD. Well nourished.  SKIN: Pink, warm and dry.  Head: Normocephalic. NECK: Trachea midline. Full ROM w/o pain or tenderness. RESPIRATORY: Chest wall symmetrical. Respirations even and non-labored. Breath sounds clear to auscultation bilaterally.  CARDIAC: S1, S2 present, regular rate and rhythm without murmur or gallops. Peripheral pulses 2+ bilaterally.  MSK: Muscle tone and strength appropriate for age.  EXTREMITIES: Without clubbing, cyanosis, or edema.  NEUROLOGIC: No motor or sensory deficits. Steady, even gait. C2-C12 intact.  PSYCH/MENTAL STATUS: Alert, oriented x 3. Cooperative, appropriate mood and affect.    Diabetic Foot Exam - Simple   Simple Foot Form Diabetic Foot exam was performed with the following findings: Yes 05/13/2023  3:45 PM  Visual Inspection No deformities, no ulcerations, no other skin breakdown bilaterally: Yes Sensation Testing Intact to touch and monofilament  testing bilaterally: Yes Pulse Check Posterior Tibialis and Dorsalis pulse intact bilaterally: Yes Comments      Health Maintenance Due  Topic Date Due   OPHTHALMOLOGY EXAM  Never done        Assessment & Plan:  1. Type 2 diabetes mellitus without complication, without long-term current use of insulin (HCC) Well controlled. Will see if Greggory Keen is approved and transition patient over to [facility] if approved. Will continue Ozempic currently and assess A1C w/ labs today. Will obtain fasting LP due to DM comorbidity. Pt doing well with diet and exercise currently. Foot exam completed and educated on foot health. Pt has completed eye exam in the past year and records requested.   - tirzepatide [facility]) 2.5 MG/0.5ML Pen; Inject 2.5 mg into the skin once a week.  Dispense: 2 mL; Refill: 0 - Hemoglobin A1c - Lipid panel  2. Acquired hypothyroidism (Primary) Stable. Pt currently reports control of thyroid symptoms. Will evaluate TSH and T4 w/ labs today and titrate medication pending results.  - TSH - T4, free   Meds ordered this encounter  Medications  tirzepatide [facility]) 2.5 MG/0.5ML Pen    Sig: Inject 2.5 mg into the skin once a week.    Dispense:  2 mL    Refill:  0    Supervising Provider:   DE [place], RAYMOND J [[id number]]   Lab Orders         Hemoglobin A1c         TSH         T4, free         Lipid panel      Return in about 6 months (around 11/13/2023) for ANNUAL PHYSICAL, DIABETES CHECK UP, Thyroid Follow up (labs at visit).    Patient to reach out to office if new, worrisome, or unresolved symptoms arise or if no improvement in patient's condition. Patient verbalized understanding and is agreeable to treatment plan. All questions answered to patient's satisfaction.    Hilbert Bible, [place]

## 2023-05-14 ENCOUNTER — Encounter (HOSPITAL_BASED_OUTPATIENT_CLINIC_OR_DEPARTMENT_OTHER): Payer: Self-pay | Admitting: *Deleted

## 2023-05-14 ENCOUNTER — Encounter (HOSPITAL_BASED_OUTPATIENT_CLINIC_OR_DEPARTMENT_OTHER): Payer: Self-pay | Admitting: Family Medicine

## 2023-05-14 LAB — T4, FREE: Free T4: 1.37 ng/dL (ref 0.82–1.77)

## 2023-05-14 LAB — LIPID PANEL
Chol/HDL Ratio: 6.8 ratio — ABNORMAL HIGH (ref 0.0–5.0)
Cholesterol, Total: 225 mg/dL — ABNORMAL HIGH (ref 100–199)
HDL: 33 mg/dL — ABNORMAL LOW (ref >39)
LDL Chol Calc (NIH): 151 mg/dL — ABNORMAL HIGH (ref 0–99)
Triglycerides: 220 mg/dL — ABNORMAL HIGH (ref 0–149)
VLDL Cholesterol Cal: 41 mg/dL — ABNORMAL HIGH (ref 5–40)

## 2023-05-14 LAB — TSH: TSH: 1.73 u[IU]/mL (ref 0.450–4.500)

## 2023-05-14 LAB — HEMOGLOBIN A1C
Est. average glucose Bld gHb Est-mCnc: 126 mg/dL
Hgb A1c MFr Bld: 6 % — ABNORMAL HIGH (ref 4.8–5.6)

## 2023-05-14 NOTE — Progress Notes (Signed)
 Hi Bryten, Your A1c has improved significantly and is at 6.0.  It was previously at 6.8 approximately 3 months ago.  Keep up the good work.  I have [facility] the [facility] and hopefully we will hear whether this has been approved or not within the next week.  If it is and we can transition you over to the [facility], I would recommend at least 2 weeks of stopping your Ozempic before starting the [facility].  Your cholesterol was significantly elevated and may have been impacted by what you ate earlier in the day.  I would recommend we recheck this with strict fasting lab within the next 1 to 2 weeks.  Your thyroid is stable and we can continue the current dosage of levothyroxine.  If you are agreeable to lab work in the next 2 weeks to recheck your cholesterol, please let me know and I will set up a lab appointment for you.

## 2023-05-27 ENCOUNTER — Other Ambulatory Visit (HOSPITAL_BASED_OUTPATIENT_CLINIC_OR_DEPARTMENT_OTHER): Payer: Self-pay

## 2023-05-27 ENCOUNTER — Other Ambulatory Visit (HOSPITAL_BASED_OUTPATIENT_CLINIC_OR_DEPARTMENT_OTHER)

## 2023-05-27 DIAGNOSIS — E200 Idiopathic hypoparathyroidism: Secondary | ICD-10-CM

## 2023-05-28 LAB — TSH+T4F+T3FREE
Free T4: 1.87 ng/dL — ABNORMAL HIGH (ref 0.82–1.77)
T3, Free: 3.1 pg/mL (ref 2.0–4.4)
TSH: 1.93 u[IU]/mL (ref 0.450–4.500)

## 2023-05-29 ENCOUNTER — Other Ambulatory Visit: Payer: Self-pay | Admitting: Family Medicine

## 2023-05-29 ENCOUNTER — Other Ambulatory Visit (HOSPITAL_BASED_OUTPATIENT_CLINIC_OR_DEPARTMENT_OTHER): Payer: Self-pay | Admitting: Family Medicine

## 2023-05-29 ENCOUNTER — Encounter (HOSPITAL_BASED_OUTPATIENT_CLINIC_OR_DEPARTMENT_OTHER): Payer: Self-pay | Admitting: Family Medicine

## 2023-05-29 DIAGNOSIS — E7800 Pure hypercholesterolemia, unspecified: Secondary | ICD-10-CM

## 2023-05-29 NOTE — Progress Notes (Signed)
 Daniel Nielsen,  I believe the team called you today as the correct lab order was drawn and released from a previous lab order placed by your previous PCP present in your labs.  I have corrected this and placed the new lipid order that was supposed to be drawn when you came in on Monday.  I will let you know the results as soon as possible.  [facility] has been notified and should not be charging you for the thyroid panel that was drawn.

## 2023-06-03 LAB — LIPID PANEL
Chol/HDL Ratio: 6.6 ratio — ABNORMAL HIGH (ref 0.0–5.0)
Cholesterol, Total: 177 mg/dL (ref 100–199)
HDL: 27 mg/dL — ABNORMAL LOW (ref >39)
LDL Chol Calc (NIH): 120 mg/dL — ABNORMAL HIGH (ref 0–99)
Triglycerides: 165 mg/dL — ABNORMAL HIGH (ref 0–149)
VLDL Cholesterol Cal: 30 mg/dL (ref 5–40)

## 2023-06-03 LAB — SPECIMEN STATUS REPORT

## 2023-06-03 NOTE — Progress Notes (Signed)
 Hi Daniel Nielsen,  Your total cholesterol did significantly decrease with fasting lab work.  As well as your triglycerides and LDL or bad cholesterol.  Your triglyceride and LDL is still slightly elevated compared to normal and does increase your risk of heart disease, heart attack and stroke.  Dietary changes such as a Mediterranean diet, increasing omega-3 can help this as well as control of type 2 diabetes.  Testosterone replacement is a common location that does increase the cholesterol.  If you would like to consider increasing Mounjaro slightly and/ or starting a omega-3 fish oil and rechecking in 6 months, I believe that would be appropriate instead of starting a statin medication.  Let me know your thoughts.

## 2023-06-06 ENCOUNTER — Other Ambulatory Visit (HOSPITAL_BASED_OUTPATIENT_CLINIC_OR_DEPARTMENT_OTHER): Payer: Self-pay | Admitting: Family Medicine

## 2023-06-06 DIAGNOSIS — E119 Type 2 diabetes mellitus without complications: Secondary | ICD-10-CM

## 2023-06-07 MED ORDER — TIRZEPATIDE 5 MG/0.5ML ~~LOC~~ SOAJ: 5.0000 mg | SUBCUTANEOUS | 2 refills | Status: DC

## 2023-06-22 ENCOUNTER — Other Ambulatory Visit: Payer: Self-pay | Admitting: Family Medicine

## 2023-06-22 DIAGNOSIS — Z789 Other specified health status: Secondary | ICD-10-CM

## 2023-06-22 DIAGNOSIS — E0580 Other thyrotoxicosis without thyrotoxic crisis or storm: Secondary | ICD-10-CM

## 2023-08-07 ENCOUNTER — Telehealth (HOSPITAL_BASED_OUTPATIENT_CLINIC_OR_DEPARTMENT_OTHER): Payer: Self-pay | Admitting: *Deleted

## 2023-08-07 ENCOUNTER — Other Ambulatory Visit (HOSPITAL_BASED_OUTPATIENT_CLINIC_OR_DEPARTMENT_OTHER): Payer: Self-pay | Admitting: Family Medicine

## 2023-08-07 DIAGNOSIS — E119 Type 2 diabetes mellitus without complications: Secondary | ICD-10-CM

## 2023-08-07 MED ORDER — TIRZEPATIDE 7.5 MG/0.5ML ~~LOC~~ SOAJ: 7.5000 mg | SUBCUTANEOUS | 2 refills | Status: DC

## 2023-08-07 NOTE — Telephone Encounter (Signed)
 Copied from CRM [id number]. Topic: Clinical - Medication Question >> Aug 07, 2023 10:54 AM Hobson Luna F wrote: Reason for CRM: Patient is calling in because he is requesting to have his tirzepatide (MOUNJARO) 5 MG/0.5ML Pen [[id number]] increased to 7.5 MG. Please advise.

## 2023-08-07 NOTE — Telephone Encounter (Signed)
 Daniel Nielsen, please advise if you are okay increasing pt's mounjaro.

## 2023-09-24 ENCOUNTER — Other Ambulatory Visit: Payer: Self-pay | Admitting: Family Medicine

## 2023-09-24 DIAGNOSIS — Z789 Other specified health status: Secondary | ICD-10-CM

## 2023-09-24 DIAGNOSIS — E0580 Other thyrotoxicosis without thyrotoxic crisis or storm: Secondary | ICD-10-CM

## 2023-11-07 ENCOUNTER — Encounter (HOSPITAL_BASED_OUTPATIENT_CLINIC_OR_DEPARTMENT_OTHER): Payer: Self-pay | Admitting: Family Medicine

## 2023-11-07 NOTE — Telephone Encounter (Signed)
 Please see mychart message sent by pt and advise.

## 2023-11-08 ENCOUNTER — Other Ambulatory Visit (HOSPITAL_BASED_OUTPATIENT_CLINIC_OR_DEPARTMENT_OTHER): Payer: Self-pay | Admitting: Family Medicine

## 2023-11-08 MED ORDER — TIRZEPATIDE 5 MG/0.5ML ~~LOC~~ SOAJ: 5.0000 mg | SUBCUTANEOUS | 2 refills | Status: DC

## 2023-11-18 ENCOUNTER — Encounter (HOSPITAL_BASED_OUTPATIENT_CLINIC_OR_DEPARTMENT_OTHER): Payer: Self-pay | Admitting: *Deleted

## 2023-11-18 ENCOUNTER — Ambulatory Visit (INDEPENDENT_AMBULATORY_CARE_PROVIDER_SITE_OTHER): Admitting: Family Medicine

## 2023-11-18 ENCOUNTER — Encounter (HOSPITAL_BASED_OUTPATIENT_CLINIC_OR_DEPARTMENT_OTHER): Payer: Self-pay | Admitting: Family Medicine

## 2023-11-18 ENCOUNTER — Other Ambulatory Visit (HOSPITAL_BASED_OUTPATIENT_CLINIC_OR_DEPARTMENT_OTHER): Payer: Self-pay

## 2023-11-18 VITALS — BP 119/83 | HR 102 | Ht 68.0 in | Wt 147.8 lb

## 2023-11-18 DIAGNOSIS — Z789 Other specified health status: Secondary | ICD-10-CM

## 2023-11-18 DIAGNOSIS — E039 Hypothyroidism, unspecified: Secondary | ICD-10-CM

## 2023-11-18 DIAGNOSIS — Z13220 Encounter for screening for lipoid disorders: Secondary | ICD-10-CM

## 2023-11-18 DIAGNOSIS — E119 Type 2 diabetes mellitus without complications: Secondary | ICD-10-CM

## 2023-11-18 DIAGNOSIS — F4310 Post-traumatic stress disorder, unspecified: Secondary | ICD-10-CM | POA: Diagnosis not present

## 2023-11-18 DIAGNOSIS — Z0000 Encounter for general adult medical examination without abnormal findings: Secondary | ICD-10-CM

## 2023-11-18 MED ORDER — LEVOTHYROXINE SODIUM 125 MCG PO TABS: 125.0000 ug | ORAL_TABLET | Freq: Every day | ORAL | 3 refills | Status: DC

## 2023-11-18 MED ORDER — TIRZEPATIDE 5 MG/0.5ML ~~LOC~~ SOAJ
5.0000 mg | SUBCUTANEOUS | 2 refills | Status: DC
  Filled 2023-11-18: qty 2, 28d supply, fill #0
  Filled 2023-12-12: qty 2, 28d supply, fill #1

## 2023-11-18 MED ORDER — LEVOTHYROXINE SODIUM 125 MCG PO TABS
125.0000 ug | ORAL_TABLET | Freq: Every day | ORAL | 3 refills | Status: AC
  Filled 2023-11-18 – 2024-03-07 (×4): qty 30, 30d supply, fill #0

## 2023-11-18 NOTE — Patient Instructions (Signed)

## 2023-11-18 NOTE — Progress Notes (Signed)
 Subjective:   Daniel Nielsen 08-17-1985  11/18/2023   CC: Chief Complaint  Patient presents with   Annual Exam    Patient is here today for physical exam. Denies any concerns for today's visit.    HPI: Daniel Nielsen is a 38 y.o. male who presents for a routine health maintenance exam. 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 He is s/p total abdominal hysterectomy and bilateral mastectomy. Currently taking testosterone  replacement. He denies concerns for his visit today.  Labs collected at time of visit.   DIABETES MELLITUS: Daniel Nielsen presents for the medical management of diabetes.  Current diabetes medication regimen: Mounjaro  7.5mg . Recently prescribed Mounjaro  5mg  due to control of diabetes and weight loss.  Patient is  adhering to a diabetic diet.  Patient is  exercising regularly.  Patient is not checking BS regularly. Patient is  checking their feet regularly.  Denies polydipsia, polyphagia, polyuria, open wounds or ulcers on feet.  Lab Results  Component Value Date   HGBA1C 6.0 (H) 05/13/2023    Foot Exam: 05/13/2023 Lab Results  Component Value Date   LABMICR 11.5 02/11/2023    Wt Readings from Last 3 Encounters:  11/18/23 147 lb 12.8 oz (67 kg)  05/13/23 194 lb (88 kg)  02/11/23 213 lb 4.8 oz (96.8 kg)   HYPOTHYROIDISM: Patient presents for the medical management of Hypothyroidism. Current medication: Levothyroxine  125mcg Patient compliant with medication regimen: yes  Fatigue: no Cold intolerance: no Weight gain/loss: no Constipation: no Lower extremity edema: no Palpitations: no Hoarseness: no Neck Pain/Compression: no Difficulty Swallowing: no  Lab Results  Component Value Date   TSH 1.930 05/27/2023    HEALTH SCREENINGS: - Vision Screening: up to date - Dental Visits: Recommended  - Pap smear: N/a ; hx of TAh - Breast Exam: Declined; hx of bilateral mastectomy - STD  Screening: Declined - Mammogram (40+): Not applicable  - Colonoscopy (45+): Not applicable  - Bone Density (65+ or under 65 with predisposing conditions): Not applicable  - Lung CA screening with low-dose CT:  Not applicable Adults age 103-80 who are current cigarette smokers or quit within the last 15 years. Must have 20 pack year history.   Depression and Anxiety Screen done today and results listed below:     11/18/2023    1:12 PM 05/13/2023    3:37 PM 10/29/2022    2:12 PM  Depression screen PHQ 2/9  Decreased Interest 0 0 0  Down, Depressed, Hopeless 0 0 0  PHQ - 2 Score 0 0 0  Altered sleeping 0 0 0  Tired, decreased energy 0 1 1  Change in appetite 0 1 0  Feeling bad or failure about yourself  0 1 0  Trouble concentrating 0 0 0  Moving slowly or fidgety/restless 0 0 0  Suicidal thoughts 0 0 0  PHQ-9 Score 0 3 1  Difficult doing work/chores Not difficult at all Not difficult at all Somewhat difficult      11/18/2023    1:12 PM 05/13/2023    3:38 PM 10/29/2022    2:12 PM  GAD 7 : Generalized Anxiety Score  Nervous, Anxious, on Edge 0 0 0  Control/stop worrying 0 0 0  Worry too much - different things 0 0 0  Trouble relaxing 0 0 0  Restless 0 0 0  Easily annoyed or irritable 0 1 0  Afraid - awful might happen 0 0 0  Total GAD 7 Score 0  1 0  Anxiety Difficulty Not difficult at all Not difficult at all Not difficult at all    IMMUNIZATIONS: - Tdap: Tetanus vaccination status reviewed: last tetanus booster within 10 years. - HPV: Recommended - Influenza: Postponed to flu season - Pneumovax: Not applicable - Prevnar 20: Not applicable - Shingrix (50+): Not applicable   Past medical history, surgical history, medications, allergies, family history and social history reviewed with patient today and changes made to appropriate areas of the chart.   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    Hypothyroid    PTSD (post-traumatic stress disorder)     Past Surgical History:  Procedure Laterality Date   COSMETIC SURGERY     Double masectomy in 2016 and hysterectomy/oophrectomy in 2019   FRACTURE SURGERY     ORIF ankle   MASTECTOMY  2016   ORIF ANKLE FRACTURE Right 01/23/2019   Procedure: OPEN REDUCTION INTERNAL FIXATION (ORIF) ANKLE FRACTURE;  Surgeon: Kendal Franky SQUIBB, MD;  Location: [facility] OR;  Service: Orthopedics;  Laterality: Right;   TOTAL ABDOMINAL HYSTERECTOMY W/ BILATERAL SALPINGOOPHORECTOMY     2019    Current Outpatient Medications on File Prior to Visit  Medication Sig   buPROPion  (WELLBUTRIN  XL) 150 MG 24 hr tablet Take 450 mg by mouth every morning.   buPROPion  (WELLBUTRIN  XL) 300 MG 24 hr tablet Take 300 mg by mouth daily.   cetirizine (ZYRTEC) 10 MG tablet Take 10 mg by mouth daily.   testosterone  cypionate (DEPOTESTOTERONE CYPIONATE) 100 MG/ML injection Inject into the muscle.   No current facility-administered medications on file prior to visit.    No Known Allergies   Social History   Socioeconomic History   Marital status: Married    Spouse name: Not on file   Number of children: Not on file   Years of education: Not on file   Highest education level: Bachelor's degree (e.g., BA, AB, BS)  Occupational History    Employer: [employer]  Tobacco Use   Smoking status: Every Day    Current packs/day: 0.50    Average packs/day: 0.5 packs/day for 22.0 years (11.0 ttl pk-yrs)    Types: Cigarettes   Smokeless tobacco: Never  Vaping Use   Vaping status: Never Used  Substance and Sexual Activity   Alcohol use: Not Currently    Comment: rarely   Drug use: Not Currently     Types: Marijuana   Sexual activity: Yes    Partners: Female    Birth control/protection: Other-see comments    Comment: Transgender - cannot get spouse pregnant naturally  Other Topics Concern   Not on file  Social History Narrative   Not on file   Social Drivers of Health   Financial Resource Strain: Low Risk  (11/14/2023)   Overall Financial Resource Strain (CARDIA)    Difficulty of Paying Living Expenses: Not hard at all  Food Insecurity: No Food Insecurity (11/14/2023)   Hunger Vital Sign    Worried About Running Out of Food in the Last Year: Never true    Ran Out of Food in the Last Year: Never true  Transportation Needs: No Transportation Needs (11/14/2023)   PRAPARE -  Administrator, Civil Service (Medical): No    Lack of Transportation (Non-Medical): No  Physical Activity: Insufficiently Active (11/14/2023)   Exercise Vital Sign    Days of Exercise per Week: 1 day    Minutes of Exercise per Session: 30 min  Stress: No Stress Concern Present (11/14/2023)   [employer] of Occupational Health - Occupational Stress Questionnaire    Feeling of Stress: Not at all  Social Connections: Moderately Isolated (11/14/2023)   Social Connection and Isolation Panel    Frequency of Communication with Friends and Family: More than three times a week    Frequency of Social Gatherings with Friends and Family: Once a week    Attends Religious Services: Never    Database administrator or Organizations: No    Attends Engineer, structural: Not on file    Marital Status: Married  Catering manager Violence: Not At Risk (05/13/2023)   Humiliation, Afraid, Rape, and Kick questionnaire    Fear of Current or Ex-Partner: No    Emotionally Abused: No    Physically Abused: No    Sexually Abused: No   Social History   Tobacco Use  Smoking Status Every Day   Current packs/day: 0.50   Average packs/day: 0.5 packs/day for 22.0 years (11.0 ttl pk-yrs)   Types: Cigarettes   Smokeless Tobacco Never   Social History   Substance and Sexual Activity  Alcohol Use Not Currently   Comment: rarely    Family History  Problem Relation Age of Onset   Healthy Mother    Alcohol abuse Father    Testicular cancer Father    Drug abuse Father    Early death Father    Cancer Father    Lung cancer Maternal Grandmother    Melanoma Maternal Grandfather    Lung cancer Paternal Grandmother    Alcohol abuse Paternal Grandfather    Alcohol abuse Paternal Uncle    Drug abuse Paternal Uncle    Obesity Paternal Uncle    Alcohol abuse Brother    Drug abuse Brother    Obesity Brother      ROS: Denies fever, fatigue, unexplained weight loss/gain, chest pain, SHOB, and palpitations. Denies neurological deficits, gastrointestinal or genitourinary complaints, and skin changes.   Objective:   Today's Vitals   11/18/23 1309  BP: 119/83  Pulse: (!) 102  SpO2: 100%  Weight: 147 lb 12.8 oz (67 kg)  Height: 5' 8 (1.727 m)    GENERAL APPEARANCE: Well-appearing, in NAD. Well nourished.  SKIN: Pink, warm and dry. Turgor normal. No rash, lesion, ulceration, or ecchymoses. Hair evenly distributed.  HEENT: HEAD: Normocephalic.  EYES: PERRLA. EOMI. Lids intact w/o defect. Sclera white, Conjunctiva pink w/o exudate.  EARS: External ear w/o redness, swelling, masses or lesions. EAC clear. TM's intact, translucent w/o bulging, appropriate landmarks visualized. Appropriate acuity to conversational tones.  NOSE: Septum midline w/o deformity. Nares patent, mucosa pink and non-inflamed w/o drainage. No sinus tenderness.  THROAT: Uvula midline. Oropharynx clear. Tonsils non-inflamed w/o exudate. Oral mucosa pink and moist.  NECK: Supple, Trachea midline. Full ROM w/o pain or tenderness. No lymphadenopathy. Thyroid non-tender w/o enlargement or palpable masses.  RESPIRATORY: Chest wall symmetrical w/o masses. Respirations even and non-labored. Breath sounds clear to auscultation  bilaterally. No wheezes, rales, rhonchi, or crackles. CARDIAC: S1, S2 present, regular rate and rhythm. No gallops, murmurs, rubs, or clicks. PMI w/o lifts, heaves, or thrills. No carotid bruits. Capillary refill <2 seconds. Peripheral pulses 2+ bilaterally.  GI: Abdomen soft w/o distention. Normoactive bowel sounds. No palpable masses or tenderness. No guarding or rebound tenderness. Liver and spleen w/o tenderness or enlargement. No CVA tenderness.  MSK: Muscle tone and strength appropriate for age, w/o atrophy or abnormal movement.  EXTREMITIES: Active ROM intact, w/o tenderness, crepitus, or contracture. No obvious joint deformities or effusions. No clubbing, edema, or cyanosis.  NEUROLOGIC: CN's II-XII intact. Motor strength symmetrical with no obvious weakness. No sensory deficits. DTR's 2+ symmetric bilaterally. Steady, even gait.  PSYCH/MENTAL STATUS: Alert, oriented x 3. Cooperative, appropriate mood and affect.    Results for orders placed or performed in visit on 06/05/23  HM DIABETES EYE EXAM   Collection Time: 04/10/23  8:05 AM  Result Value Ref Range   HM Diabetic Eye Exam      Assessment & Plan:  1. Annual physical exam (Primary) Discussed preventative screenings, vaccines, and healthy lifestyle with patient. Recommended HPV vaccine completion of series and Flu vaccine. Labs obtained as part of AE.  - CBC with Differential/Platelet - Comprehensive metabolic panel with GFR  2. Male-to-male transgender person Patient denies current concerns. Will continue current hormone replacement as directed.   3. Screening for lipid disorders - Lipid panel  4. Type 2 diabetes mellitus without complication, without long-term current use of insulin (HCC) Controlled. Decrease Mounjaro  to 5mg  weekly. Rx sent into [facility]. Will check A1C with labs. Discussed possible further titration if needed.  - Hemoglobin A1c - tirzepatide  (MOUNJARO ) 5 MG/0.5ML Pen; Inject 5 mg into  the skin once a week.  Dispense: 6 mL; Refill: 2  5. Acquired hypothyroidism Controlled. Check TSH with labs today. Will continue levothyroxine  as directed and titrate if needed pending labs.  - TSH - levothyroxine  (SYNTHROID ) 125 MCG tablet; Take 1 tablet (125 mcg total) by mouth daily before breakfast.  Dispense: 90 tablet; Refill: 3  7. PTSD Currently well controlled with Wellbutrin  450mg  daily.   Orders Placed This Encounter  Procedures   CBC with Differential/Platelet   Comprehensive metabolic panel with GFR   Lipid panel   TSH   Hemoglobin A1c    PATIENT COUNSELING:  - Encouraged a healthy well-balanced diet. Patient may adjust caloric intake to maintain or achieve ideal body weight. May reduce intake of dietary saturated fat and total fat and have adequate dietary potassium and calcium preferably from fresh fruits, vegetables, and low-fat dairy products.   - Advised to avoid cigarette smoking. - Discussed with the patient that most people either abstain from alcohol or drink within safe limits (<=14/week and <=4 drinks/occasion for males, <=7/weeks and <= 3 drinks/occasion for females) and that the risk for alcohol disorders and other health effects rises proportionally with the number of drinks per week and how often a drinker exceeds daily limits. - Discussed cessation/primary prevention of drug use and availability of treatment for abuse.  - Discussed sexually transmitted diseases, avoidance of unintended pregnancy and contraceptive alternatives.  - Stressed the importance of regular exercise - Injury prevention: Discussed safety belts, safety helmets, smoke detector, smoking near bedding or upholstery.  - Dental health: Discussed importance of regular tooth brushing, flossing, and dental visits.   NEXT PREVENTATIVE PHYSICAL DUE IN 1 YEAR.  Return in about 6 months (around 05/17/2024) for DIABETES CHECK UP, Hypothyroidism .  Patient to reach out to office if new, worrisome,  or unresolved symptoms arise or if no improvement in patient's condition. Patient verbalized understanding and is agreeable to treatment plan. All questions answered to patient's satisfaction.  Thersia Schuyler Stark, [place]

## 2023-11-19 ENCOUNTER — Ambulatory Visit (HOSPITAL_BASED_OUTPATIENT_CLINIC_OR_DEPARTMENT_OTHER): Payer: Self-pay | Admitting: Family Medicine

## 2023-11-19 LAB — TSH: TSH: 0.749 u[IU]/mL (ref 0.450–4.500)

## 2023-11-19 LAB — COMPREHENSIVE METABOLIC PANEL WITH GFR
ALT: 12 IU/L (ref 0–44)
AST: 16 IU/L (ref 0–40)
Albumin: 4.5 g/dL (ref 4.1–5.1)
Alkaline Phosphatase: 83 IU/L (ref 47–123)
BUN/Creatinine Ratio: 17 (ref 9–20)
BUN: 20 mg/dL (ref 6–20)
Bilirubin Total: 0.5 mg/dL (ref 0.0–1.2)
CO2: 19 mmol/L — ABNORMAL LOW (ref 20–29)
Calcium: 9.8 mg/dL (ref 8.7–10.2)
Chloride: 102 mmol/L (ref 96–106)
Creatinine, Ser: 1.16 mg/dL (ref 0.76–1.27)
Globulin, Total: 2.3 g/dL (ref 1.5–4.5)
Glucose: 83 mg/dL (ref 70–99)
Potassium: 5 mmol/L (ref 3.5–5.2)
Sodium: 138 mmol/L (ref 134–144)
Total Protein: 6.8 g/dL (ref 6.0–8.5)
eGFR: 84 mL/min/1.73 (ref >59)

## 2023-11-19 LAB — LIPID PANEL
Chol/HDL Ratio: 5.6 ratio — ABNORMAL HIGH (ref 0.0–5.0)
Cholesterol, Total: 169 mg/dL (ref 100–199)
HDL: 30 mg/dL — ABNORMAL LOW (ref >39)
LDL Chol Calc (NIH): 121 mg/dL — ABNORMAL HIGH (ref 0–99)
Triglycerides: 98 mg/dL (ref 0–149)
VLDL Cholesterol Cal: 18 mg/dL (ref 5–40)

## 2023-11-19 LAB — CBC WITH DIFFERENTIAL/PLATELET
Basophils Absolute: 0 x10E3/uL (ref 0.0–0.2)
Basos: 0 %
EOS (ABSOLUTE): 0.1 x10E3/uL (ref 0.0–0.4)
Eos: 1 %
Hematocrit: 51.4 % — ABNORMAL HIGH (ref 37.5–51.0)
Hemoglobin: 16.8 g/dL (ref 13.0–17.7)
Immature Grans (Abs): 0 x10E3/uL (ref 0.0–0.1)
Immature Granulocytes: 0 %
Lymphocytes Absolute: 1.9 x10E3/uL (ref 0.7–3.1)
Lymphs: 22 %
MCH: 31.1 pg (ref 26.6–33.0)
MCHC: 32.7 g/dL (ref 31.5–35.7)
MCV: 95 fL (ref 79–97)
Monocytes Absolute: 0.6 x10E3/uL (ref 0.1–0.9)
Monocytes: 7 %
Neutrophils Absolute: 6.3 x10E3/uL (ref 1.4–7.0)
Neutrophils: 70 %
Platelets: 259 x10E3/uL (ref 150–450)
RBC: 5.4 x10E6/uL (ref 4.14–5.80)
RDW: 12.7 % (ref 11.6–15.4)
WBC: 8.9 x10E3/uL (ref 3.4–10.8)

## 2023-11-19 LAB — HEMOGLOBIN A1C
Est. average glucose Bld gHb Est-mCnc: 100 mg/dL
Hgb A1c MFr Bld: 5.1 % (ref 4.8–5.6)

## 2023-11-19 NOTE — Progress Notes (Signed)
 Hi Daniel Nielsen,  Your A1C is down to 5.1. This is excellent. At this time, if you would like to try to wean off the mounjaro  I believe we could try this to manage with diet and exercise. We could also stay on Mounjaro  2.5 if needed. Let me know what you would prefer to do.   Your thyroid function is stable. Your triglycerides and total cholesterol are also well controlled and LDL has improved. Your electrolytes, liver function are stable. Please increase clear fluids to support kidney function. Your hematocrit was slightly elevated and can be due to mild dehydration and also testosterone . We will continue to monitor.

## 2023-12-15 ENCOUNTER — Encounter (HOSPITAL_BASED_OUTPATIENT_CLINIC_OR_DEPARTMENT_OTHER): Payer: Self-pay | Admitting: Family Medicine

## 2023-12-16 ENCOUNTER — Other Ambulatory Visit: Payer: Self-pay

## 2023-12-16 ENCOUNTER — Other Ambulatory Visit (HOSPITAL_BASED_OUTPATIENT_CLINIC_OR_DEPARTMENT_OTHER): Payer: Self-pay | Admitting: Family Medicine

## 2023-12-16 ENCOUNTER — Other Ambulatory Visit (HOSPITAL_BASED_OUTPATIENT_CLINIC_OR_DEPARTMENT_OTHER): Payer: Self-pay

## 2023-12-16 DIAGNOSIS — E119 Type 2 diabetes mellitus without complications: Secondary | ICD-10-CM

## 2023-12-16 MED ORDER — TIRZEPATIDE 5 MG/0.5ML ~~LOC~~ SOAJ
5.0000 mg | SUBCUTANEOUS | 3 refills | Status: AC
  Filled 2024-01-11: qty 2, 28d supply, fill #0
  Filled 2024-02-08: qty 2, 28d supply, fill #1
  Filled 2024-03-07: qty 2, 28d supply, fill #2
  Filled 2024-04-04: qty 2, 28d supply, fill #3

## 2023-12-16 NOTE — Telephone Encounter (Signed)
 Please see mychart message sent by pt and advise.

## 2024-01-12 ENCOUNTER — Other Ambulatory Visit (HOSPITAL_COMMUNITY): Payer: Self-pay

## 2024-01-13 ENCOUNTER — Other Ambulatory Visit: Payer: Self-pay

## 2024-01-15 ENCOUNTER — Other Ambulatory Visit (HOSPITAL_COMMUNITY): Payer: Self-pay

## 2024-01-15 ENCOUNTER — Other Ambulatory Visit (HOSPITAL_BASED_OUTPATIENT_CLINIC_OR_DEPARTMENT_OTHER): Payer: Self-pay

## 2024-01-15 ENCOUNTER — Other Ambulatory Visit: Payer: Self-pay

## 2024-02-08 ENCOUNTER — Other Ambulatory Visit (HOSPITAL_COMMUNITY): Payer: Self-pay

## 2024-02-10 ENCOUNTER — Other Ambulatory Visit: Payer: Self-pay

## 2024-02-11 ENCOUNTER — Other Ambulatory Visit: Payer: Self-pay

## 2024-02-11 ENCOUNTER — Encounter: Payer: Self-pay | Admitting: Pharmacist

## 2024-02-11 ENCOUNTER — Other Ambulatory Visit (HOSPITAL_COMMUNITY): Payer: Self-pay

## 2024-03-07 ENCOUNTER — Other Ambulatory Visit (HOSPITAL_COMMUNITY): Payer: Self-pay

## 2024-03-11 ENCOUNTER — Other Ambulatory Visit: Payer: Self-pay

## 2024-05-07 ENCOUNTER — Ambulatory Visit (HOSPITAL_BASED_OUTPATIENT_CLINIC_OR_DEPARTMENT_OTHER): Admitting: Family Medicine
